# Patient Record
Sex: Female | Born: 1985 | Race: White | Hispanic: No | Marital: Married | Smoking: Never smoker
Health system: Southern US, Community
[De-identification: ages and names within clinical notes are randomized; demographics above are authoritative.]

## PROBLEM LIST (undated history)

## (undated) DIAGNOSIS — M1990 Unspecified osteoarthritis, unspecified site: Secondary | ICD-10-CM

## (undated) DIAGNOSIS — N8000 Endometriosis of the uterus, unspecified: Secondary | ICD-10-CM

## (undated) HISTORY — DX: Unspecified osteoarthritis, unspecified site: M19.90

## (undated) HISTORY — PX: TONSILLECTOMY: SUR1361

## (undated) HISTORY — DX: Endometriosis of the uterus, unspecified: N80.00

## (undated) HISTORY — DX: Endometriosis of uterus: N80.0

---
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
## 2007-01-14 ENCOUNTER — Emergency Department: Payer: Self-pay | Admitting: Emergency Medicine

## 2007-08-19 ENCOUNTER — Ambulatory Visit: Payer: Self-pay | Admitting: Family Medicine

## 2008-03-20 ENCOUNTER — Ambulatory Visit: Payer: Self-pay | Admitting: Family Medicine

## 2008-09-11 ENCOUNTER — Emergency Department: Payer: Self-pay | Admitting: Emergency Medicine

## 2009-05-02 DIAGNOSIS — N809 Endometriosis, unspecified: Secondary | ICD-10-CM | POA: Insufficient documentation

## 2009-07-10 ENCOUNTER — Ambulatory Visit: Payer: Self-pay | Admitting: Internal Medicine

## 2009-07-11 ENCOUNTER — Ambulatory Visit: Payer: Self-pay | Admitting: Internal Medicine

## 2009-11-16 HISTORY — PX: LAPAROSCOPY: SHX197

## 2010-02-09 ENCOUNTER — Emergency Department: Payer: Self-pay | Admitting: Emergency Medicine

## 2010-02-11 ENCOUNTER — Emergency Department: Payer: Self-pay | Admitting: Unknown Physician Specialty

## 2010-03-17 ENCOUNTER — Emergency Department: Payer: Self-pay | Admitting: Emergency Medicine

## 2010-03-19 ENCOUNTER — Emergency Department: Payer: Self-pay | Admitting: Emergency Medicine

## 2010-04-22 ENCOUNTER — Ambulatory Visit: Payer: Self-pay | Admitting: Family Medicine

## 2010-06-15 ENCOUNTER — Emergency Department: Payer: Self-pay | Admitting: Emergency Medicine

## 2010-06-21 ENCOUNTER — Emergency Department: Payer: Self-pay | Admitting: Emergency Medicine

## 2010-07-13 IMAGING — CT CT ABD-PELV W/O CM
1 of 2 series · 15 of 32 positions shown, 19 images · non-contrast
Comparison: none

REASON FOR EXAM: (1) RUQ pain; (2) RLQ pain
COMMENTS:   May transport without cardiac monitor

PROCEDURE:     CT  - CT ABDOMEN AND PELVIS W[DATE]  [DATE]
RESULT:
TECHNIQUE: Noncontrast renal stone protocol CT of the abdomen and pelvis is
performed. Images are reconstructed in the axial plane at 3 mm slice
thickness.
There is no previous exam for comparison.

[Series 2: stone · axial · 0.60mm/px · z∈[-979,-592]mm · 15 of 146 slices shown, 19 images]
[im 11/146  soft-tissue]
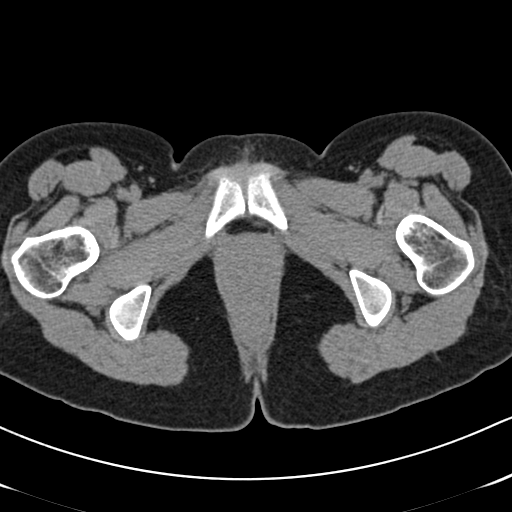
[im 11/146  bone]
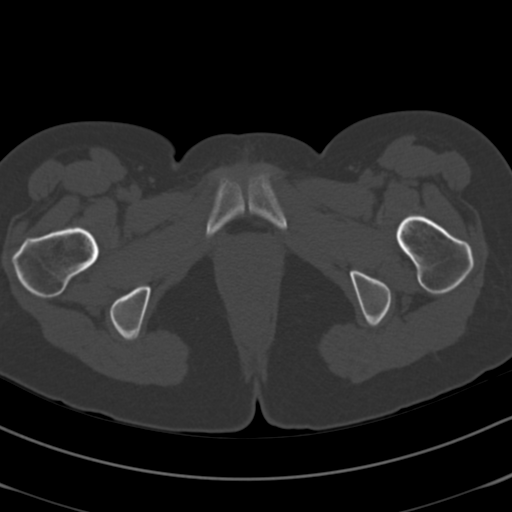
[im 21/146  soft-tissue]
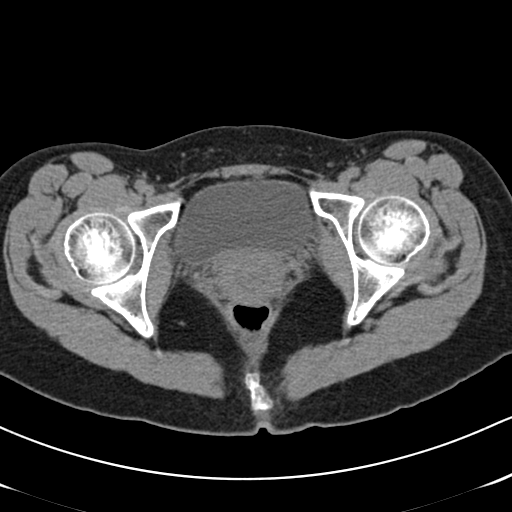
[im 32/146  soft-tissue]
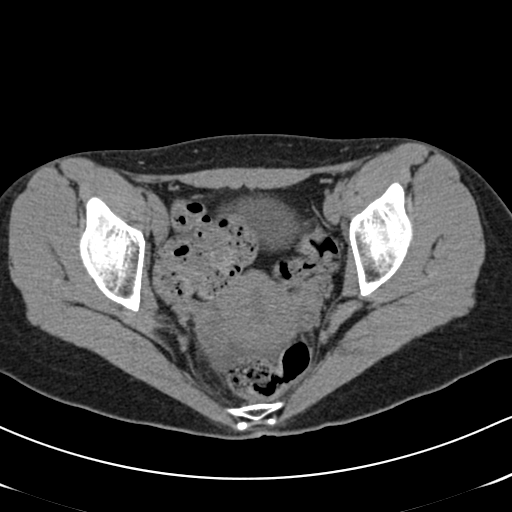
[im 42/146  soft-tissue]
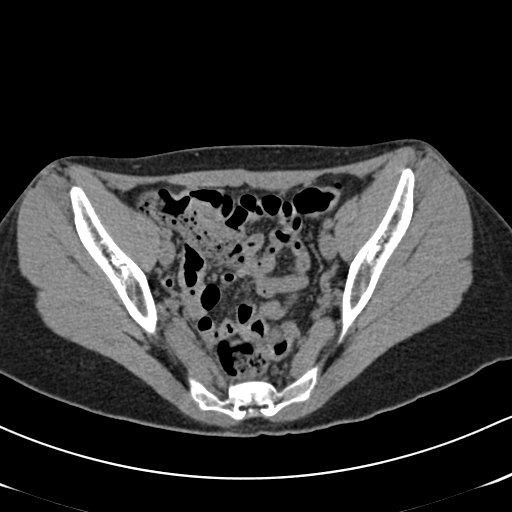
[im 52/146  soft-tissue]
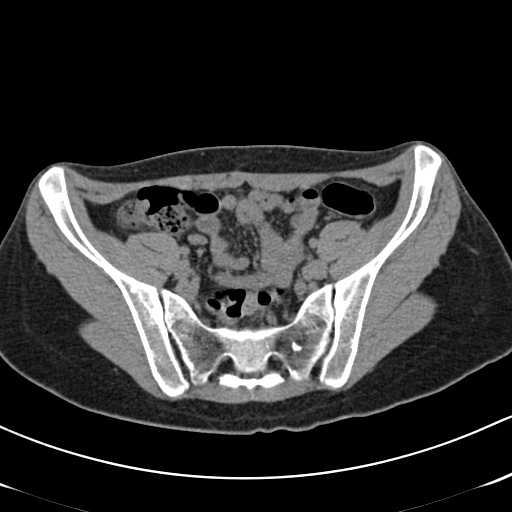
[im 63/146  soft-tissue]
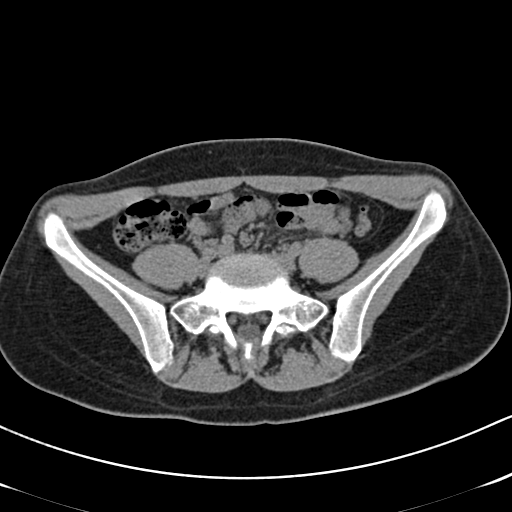
[im 73/146  soft-tissue]
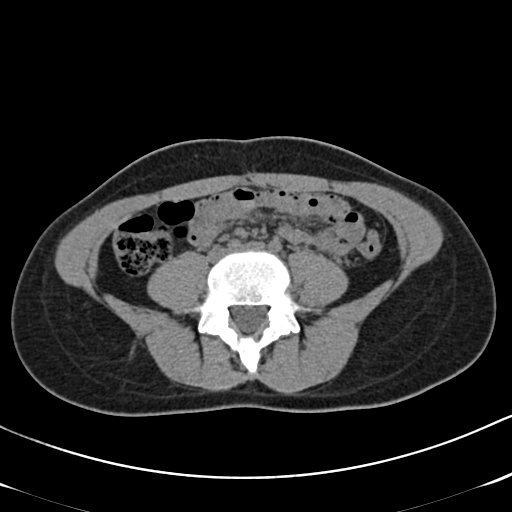
[im 83/146  soft-tissue]
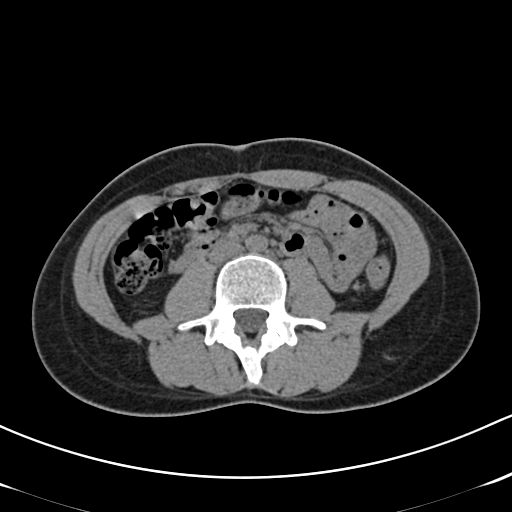
[im 94/146  soft-tissue]
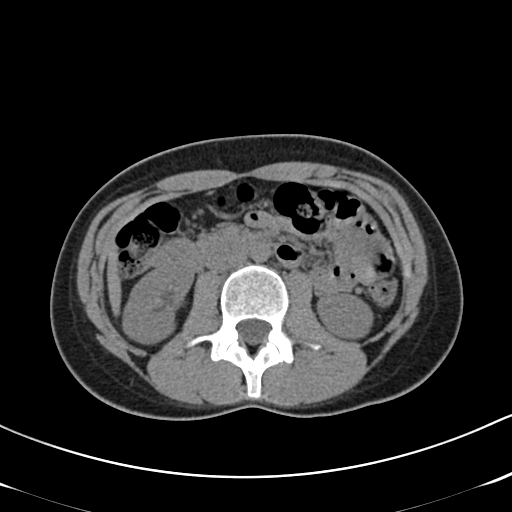
[im 94/146  bone]
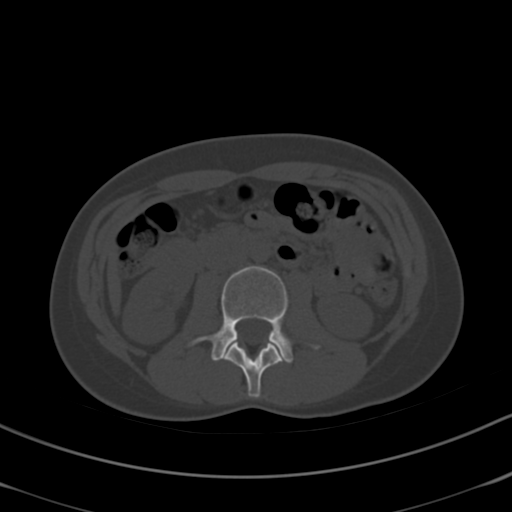
[im 104/146  soft-tissue]
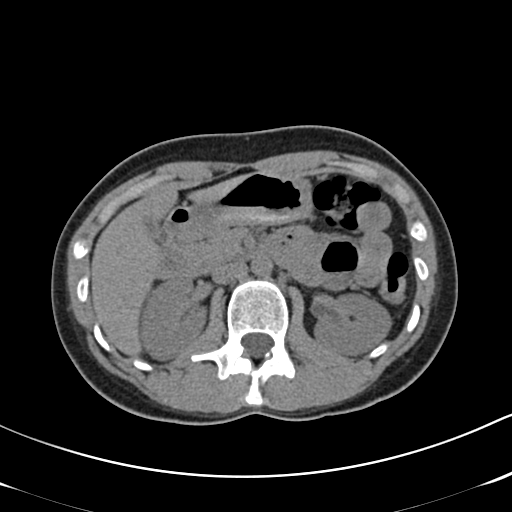
[im 114/146  soft-tissue]
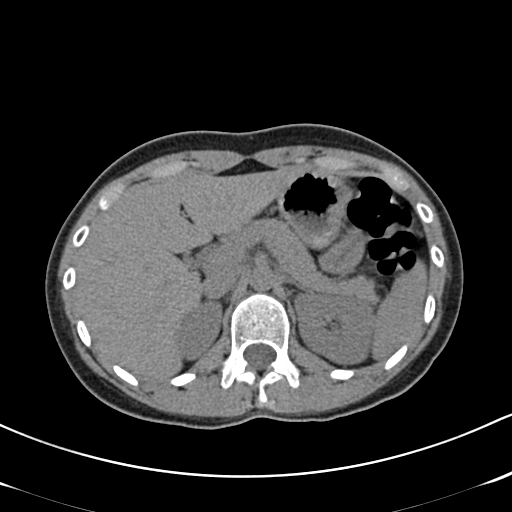
[im 125/146  soft-tissue]
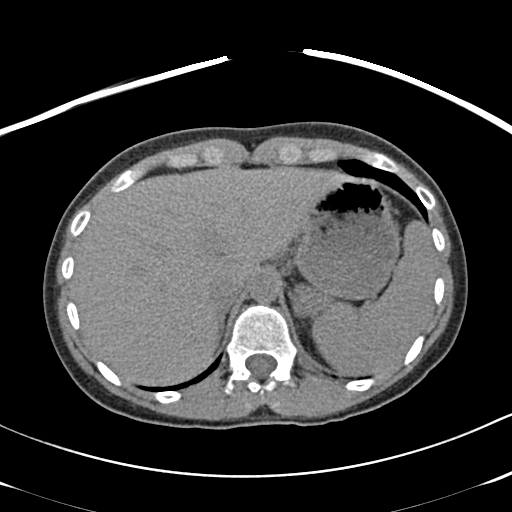
[im 125/146  lung]
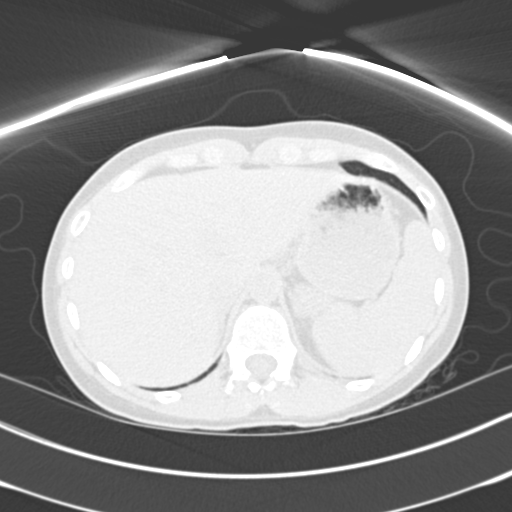
[im 130/146  lung]
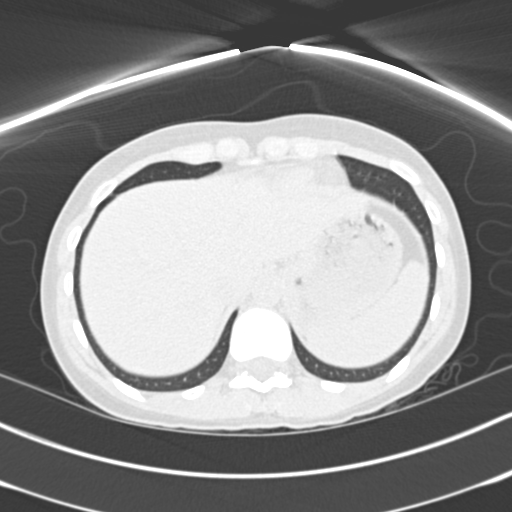
[im 135/146  soft-tissue]
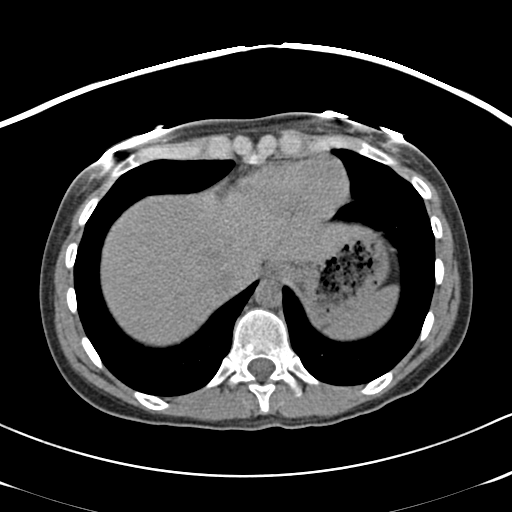
[im 135/146  lung]
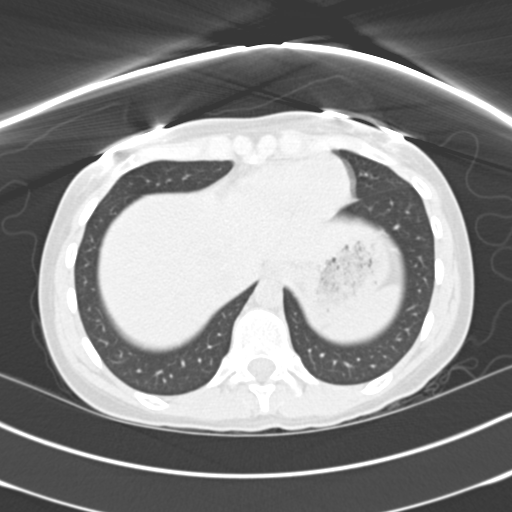
[im 140/146  lung]
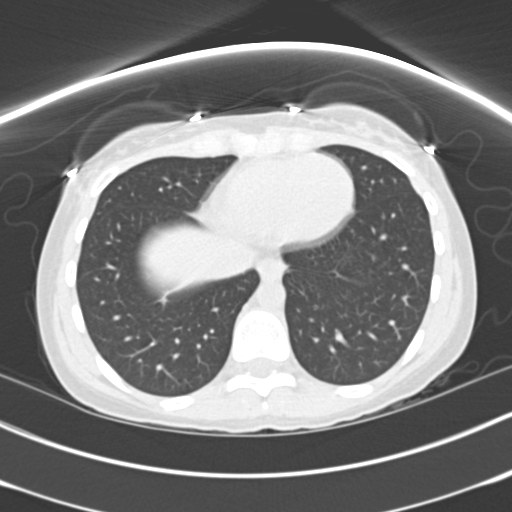

[15 of 32 positions shown; findings below may reference images not displayed]

FINDINGS: The lung bases appear clear. Noncontrast images of the liver,
spleen, pancreas, kidneys, aorta, adrenal glands and gallbladder appear
within normal limits. The gallbladder is not fully distended but no stones
are evident. The urinary bladder, uterus and ovaries appear unremarkable.
There is no abnormal bowel distention. There is a nonspecific small amount
of rectouterine cul-de-sac fluid. There is no abscess, free air or bowel
wall thickening. The appendix is seen and appears normal. No urinary tract
stones or obstructive changes are seen.
IMPRESSION: 1.  No urinary calculi or obstructive changes are present.
2.  The appendix appears normal.
3.  Possible right ovarian cyst.
4.  Small amount of free fluid in the cul-de-sac.

## 2010-07-18 ENCOUNTER — Ambulatory Visit: Payer: Self-pay

## 2010-07-24 ENCOUNTER — Ambulatory Visit: Payer: Self-pay

## 2010-07-30 ENCOUNTER — Emergency Department: Payer: Self-pay | Admitting: Emergency Medicine

## 2010-08-28 ENCOUNTER — Emergency Department: Payer: Self-pay | Admitting: Emergency Medicine

## 2011-01-17 ENCOUNTER — Emergency Department: Payer: Self-pay | Admitting: Emergency Medicine

## 2011-08-08 ENCOUNTER — Emergency Department: Payer: Self-pay | Admitting: Emergency Medicine

## 2012-03-17 ENCOUNTER — Emergency Department: Payer: Self-pay | Admitting: Internal Medicine

## 2012-03-17 LAB — URINALYSIS, COMPLETE
Glucose,UR: NEGATIVE mg/dL (ref 0–75)
Leukocyte Esterase: NEGATIVE
Nitrite: NEGATIVE
RBC,UR: <1 /HPF (ref 0–5)
Specific Gravity: 1.006 (ref 1.003–1.030)
WBC UR: <1 /HPF (ref 0–5)

## 2012-03-29 ENCOUNTER — Emergency Department: Payer: Self-pay | Admitting: Emergency Medicine

## 2012-03-29 LAB — URINALYSIS, COMPLETE
Bilirubin,UR: NEGATIVE
Glucose,UR: NEGATIVE mg/dL (ref 0–75)
RBC,UR: 1 /HPF (ref 0–5)
Specific Gravity: 1.025 (ref 1.003–1.030)
Squamous Epithelial: 8
WBC UR: 5 /HPF (ref 0–5)

## 2012-03-29 LAB — CBC
HCT: 40.1 % (ref 35.0–47.0)
HGB: 13.8 g/dL (ref 12.0–16.0)
MCH: 31 pg (ref 26.0–34.0)
MCV: 90 fL (ref 80–100)
Platelet: 279 10*3/uL (ref 150–440)
RBC: 4.46 10*6/uL (ref 3.80–5.20)
RDW: 13.5 % (ref 11.5–14.5)
WBC: 10.1 10*3/uL (ref 3.6–11.0)

## 2012-03-29 LAB — COMPREHENSIVE METABOLIC PANEL
Alkaline Phosphatase: 59 U/L (ref 50–136)
BUN: 5 mg/dL — ABNORMAL LOW (ref 7–18)
Bilirubin,Total: 0.7 mg/dL (ref 0.2–1.0)
Calcium, Total: 9.4 mg/dL (ref 8.5–10.1)
Creatinine: 0.47 mg/dL — ABNORMAL LOW (ref 0.60–1.30)
EGFR (African American): >60
Osmolality: 273 (ref 275–301)
Potassium: 4.1 mmol/L (ref 3.5–5.1)
SGOT(AST): 26 U/L (ref 15–37)
Total Protein: 7.8 g/dL (ref 6.4–8.2)

## 2012-03-29 LAB — HCG, QUANTITATIVE, PREGNANCY: Beta Hcg, Quant.: 78239 m[IU]/mL — ABNORMAL HIGH

## 2012-10-07 ENCOUNTER — Observation Stay: Payer: Self-pay | Admitting: Obstetrics and Gynecology

## 2012-11-04 ENCOUNTER — Ambulatory Visit: Payer: Self-pay | Admitting: Obstetrics and Gynecology

## 2012-11-11 ENCOUNTER — Inpatient Hospital Stay: Payer: Self-pay | Admitting: Obstetrics and Gynecology

## 2012-11-11 LAB — CBC WITH DIFFERENTIAL/PLATELET
Basophil #: 0.1 10*3/uL (ref 0.0–0.1)
Basophil %: 0.6 %
Eosinophil #: 0.6 10*3/uL (ref 0.0–0.7)
HCT: 34.4 % — ABNORMAL LOW (ref 35.0–47.0)
Lymphocyte #: 3.5 10*3/uL (ref 1.0–3.6)
Lymphocyte %: 15.7 %
MCH: 29.6 pg (ref 26.0–34.0)
MCHC: 34.9 g/dL (ref 32.0–36.0)
MCV: 85 fL (ref 80–100)
Monocyte #: 0.9 x10 3/mm (ref 0.2–0.9)
Monocyte %: 3.9 %
Neutrophil #: 17.1 10*3/uL — ABNORMAL HIGH (ref 1.4–6.5)
Platelet: 305 10*3/uL (ref 150–440)
RDW: 13.9 % (ref 11.5–14.5)
WBC: 22.2 10*3/uL — ABNORMAL HIGH (ref 3.6–11.0)

## 2012-11-13 LAB — HEMATOCRIT: HCT: 29.6 % — ABNORMAL LOW (ref 35.0–47.0)

## 2015-03-26 NOTE — H&P (Signed)
L&D Evaluation:  History:   HPI 29yo G1 at 7876w1d by D= 1st trimester U/S presents with c/o contractions increasing in frequency and intensity since membrane stripping this morning in the office.  No LOF or VB.  Good FM.  [id number]  PNLL - O+, RI, VI, GBS neg    Presents with contractions    Patient's Medical History bulging disk    Patient's Surgical History dx laparoscopy    Medications Pre Natal Vitamins    Allergies Sulfa, imitrex    Social History none    Family History Non-Contributory   ROS:   ROS All systems were reviewed.  HEENT, CNS, GI, GU, Respiratory, CV, Renal and Musculoskeletal systems were found to be normal.   Exam:   Vital Signs stable    General no apparent distress    Mental Status clear    Chest normal effort    Abdomen gravid, non-tender    Estimated Fetal Weight Average for gestational age    Pelvic no external lesions, 1 --> 2cm over 1 hour on L&D    Mebranes Intact    FHT normal rate with no decels, reactive NST    Ucx regular, every 3-5 min    Skin dry    Lymph no lymphadenopathy    Impression:   Impression G1 at 5076w1d in early labor   Plan:   Plan EFM/NST, monitor contractions and for cervical change    Comments expectant management   Electronic Signatures: Garnette GunnerStansbury Clipp, Ali LoweEryn K (MD)  (Signed 27-Dec-13 17:26)  Authored: L&D Evaluation   Last Updated: 27-Dec-13 17:26 by Garnette GunnerStansbury Clipp, Ali LoweEryn K (MD)

## 2015-11-18 ENCOUNTER — Emergency Department: Payer: Self-pay

## 2015-11-18 ENCOUNTER — Encounter: Payer: Self-pay | Admitting: Emergency Medicine

## 2015-11-18 ENCOUNTER — Emergency Department
Admission: EM | Admit: 2015-11-18 | Discharge: 2015-11-18 | Disposition: A | Discharge status: Home / Self Care | Payer: Self-pay | Attending: Emergency Medicine | Admitting: Emergency Medicine

## 2015-11-18 DIAGNOSIS — W07XXXA Fall from chair, initial encounter: Secondary | ICD-10-CM | POA: Insufficient documentation

## 2015-11-18 DIAGNOSIS — Y9289 Other specified places as the place of occurrence of the external cause: Secondary | ICD-10-CM | POA: Insufficient documentation

## 2015-11-18 DIAGNOSIS — W19XXXA Unspecified fall, initial encounter: Secondary | ICD-10-CM

## 2015-11-18 DIAGNOSIS — S52121A Displaced fracture of head of right radius, initial encounter for closed fracture: Secondary | ICD-10-CM | POA: Insufficient documentation

## 2015-11-18 DIAGNOSIS — Y998 Other external cause status: Secondary | ICD-10-CM | POA: Insufficient documentation

## 2015-11-18 DIAGNOSIS — S3992XA Unspecified injury of lower back, initial encounter: Secondary | ICD-10-CM | POA: Insufficient documentation

## 2015-11-18 DIAGNOSIS — Y9389 Activity, other specified: Secondary | ICD-10-CM | POA: Insufficient documentation

## 2015-11-18 MED ORDER — NAPROXEN 500 MG PO TABS
500.0000 mg | ORAL_TABLET | Freq: Two times a day (BID) | ORAL | Status: DC
Start: 2015-11-18 — End: 2020-09-25

## 2015-11-18 MED ORDER — OXYCODONE-ACETAMINOPHEN 5-325 MG PO TABS: 1.0000 | ORAL_TABLET | ORAL | Status: DC | PRN

## 2015-11-18 NOTE — ED Provider Notes (Signed)
[facility] Emergency Department Provider Note  ____________________________________________  Time seen: Approximately 1:44 PM  I have reviewed the triage vital signs and the nursing notes.   HISTORY  Chief Complaint Arm Pain and Back Pain   HPI Penny Day is a 30 y.o. female who presents with complaints of falling off a chair on Saturday while trying to take down [employer] decorations. States that she landed on her right arm, elbow and lower back. Complains of bruising to her lower back.   History reviewed. No pertinent past medical history.  There are no active problems to display for this patient.   History reviewed. No pertinent past surgical history.  Current Outpatient Rx  Name  Route  Sig  Dispense  Refill  . naproxen (NAPROSYN) 500 MG tablet   Oral   Take 1 tablet (500 mg total) by mouth 2 (two) times daily with a meal.   60 tablet   0   . oxyCODONE-acetaminophen (ROXICET) 5-325 MG tablet   Oral   Take 1-2 tablets by mouth every 4 (four) hours as needed for severe pain.   15 tablet   0     Allergies Sulfa antibiotics  No family history on file.  Social History Social History  Substance Use Topics  . Smoking status: Unknown If Ever Smoked  . Smokeless tobacco: None  . Alcohol Use: No    Review of Systems Constitutional: No fever/chills Eyes: No visual changes. ENT: No sore throat. Cardiovascular: Denies chest pain. Respiratory: Denies shortness of breath. Gastrointestinal: No abdominal pain.  No nausea, no vomiting.  No diarrhea.  No constipation. Genitourinary: Negative for dysuria. Musculoskeletal: Positive for right elbow pain. Skin: Negative for rash. Neurological: Negative for headaches, focal weakness or numbness.  10-point ROS otherwise negative.  ____________________________________________   PHYSICAL EXAM:  VITAL SIGNS: ED Triage Vitals  Enc Vitals Group     BP 11/18/15 1225 119/80 mmHg     Pulse  Rate 11/18/15 1225 97     Resp 11/18/15 1225 16     Temp 11/18/15 1225 98.1 F (36.7 C)     Temp Source 11/18/15 1225 Oral     SpO2 11/18/15 1225 98 %     Weight 11/18/15 1307 120 lb (54.432 kg)     Height 11/18/15 1307 5\' 5"  (1.651 m)     Head Cir --      Peak Flow --      Pain Score 11/18/15 1226 8     Pain Loc --      Pain Edu? --      Excl. in GC? --     Constitutional: Alert and oriented. Well appearing and in no acute distress. Cardiovascular: Normal rate, regular rhythm. Grossly normal heart sounds.  Good peripheral circulation. Respiratory: Normal respiratory effort.  No retractions. Lungs CTAB. Musculoskeletal: Right arm with limited range of motion. Increased pain with full extension. Distally neurovascularly intact. Neurologic:  Normal speech and language. No gross focal neurologic deficits are appreciated. No gait instability. Skin:  Skin is warm, dry and intact. No rash noted. Psychiatric: Mood and affect are normal. Speech and behavior are normal.  ____________________________________________   LABS (all labs ordered are listed, but only abnormal results are displayed)  Labs Reviewed - No data to display   RADIOLOGY   FINDINGS: Four views study shows no definite fracture. There is some subtle cortical irregularity along the anterior radial head, but no definite fracture line visible. No subluxation or dislocation. Posterior fat  pad is visible and the anterior fat pad is elevated, imaging features consistent with joint effusion.  IMPRESSION: Joint effusion without a definite fracture evident. Occult radial head fracture would be a consideration. _________________________________________   PROCEDURES  Procedure(s) performed: None  Critical Care performed: No  ____________________________________________   INITIAL IMPRESSION / ASSESSMENT AND PLAN / ED COURSE  Pertinent labs & imaging results that were available during my care of the patient were  reviewed by me and considered in my medical decision making (see chart for details).  Radial head fracture. Rx given for posterior splint Rx Naprosyn, Percocet 5/325. Sling applied. Patient follow-up with orthopedics in the morning. Patient voices no other emergency medical complaints at this time. ____________________________________________   FINAL CLINICAL IMPRESSION(S) / ED DIAGNOSES  Final diagnoses:  Radial head fracture, closed, right, initial encounter      Evangeline Dakin, PA-C 11/18/15 1353  Sharyn Creamer, MD 11/18/15 [phone]

## 2015-11-18 NOTE — ED Notes (Signed)
Pt reports falling off chair on Saturday while trying to take down birthday decorations; reports continued pain to right arm and right lower back; reports bruising to lower back. Pt able to move fingers and distal sensation intact.

## 2015-11-18 NOTE — ED Notes (Signed)
States she fell from chair on sat  Pain and swelling noted to right elbow  Bruising noted to left upper arm

## 2015-11-18 NOTE — Discharge Instructions (Signed)
Cast or Splint Care °Casts and splints support injured limbs and keep bones from moving while they heal. It is important to care for your cast or splint at home.   °HOME CARE INSTRUCTIONS °· Keep the cast or splint uncovered during the drying period. It can take 24 to 48 hours to dry if it is made of plaster. A fiberglass cast will dry in less than 1 hour. °· Do not rest the cast on anything harder than a pillow for the first 24 hours. °· Do not put weight on your injured limb or apply pressure to the cast until your health care provider gives you permission. °· Keep the cast or splint dry. Wet casts or splints can lose their shape and may not support the limb as well. A wet cast that has lost its shape can also create harmful pressure on your skin when it dries. Also, wet skin can become infected. °· Cover the cast or splint with a plastic bag when bathing or when out in the rain or snow. If the cast is on the trunk of the body, take sponge baths until the cast is removed. °· If your cast does become wet, dry it with a towel or a blow dryer on the cool setting only. °· Keep your cast or splint clean. Soiled casts may be wiped with a moistened cloth. °· Do not place any hard or soft foreign objects under your cast or splint, such as cotton, toilet paper, lotion, or powder. °· Do not try to scratch the skin under the cast with any object. The object could get stuck inside the cast. Also, scratching could lead to an infection. If itching is a problem, use a blow dryer on a cool setting to relieve discomfort. °· Do not trim or cut your cast or remove padding from inside of it. °· Exercise all joints next to the injury that are not immobilized by the cast or splint. For example, if you have a long leg cast, exercise the hip joint and toes. If you have an arm cast or splint, exercise the shoulder, elbow, thumb, and fingers. °· Elevate your injured arm or leg on 1 or 2 pillows for the first 1 to 3 days to decrease  swelling and pain. It is best if you can comfortably elevate your cast so it is higher than your heart. °SEEK MEDICAL CARE IF:  °· Your cast or splint cracks. °· Your cast or splint is too tight or too loose. °· You have unbearable itching inside the cast. °· Your cast becomes wet or develops a soft spot or area. °· You have a bad smell coming from inside your cast. °· You get an object stuck under your cast. °· Your skin around the cast becomes red or raw. °· You have new pain or worsening pain after the cast has been applied. °SEEK IMMEDIATE MEDICAL CARE IF:  °· You have fluid leaking through the cast. °· You are unable to move your fingers or toes. °· You have discolored (blue or white), cool, painful, or very swollen fingers or toes beyond the cast. °· You have tingling or numbness around the injured area. °· You have severe pain or pressure under the cast. °· You have any difficulty with your breathing or have shortness of breath. °· You have chest pain. °  °This information is not intended to replace advice given to you by your health care provider. Make sure you discuss any questions you have with your health care   provider.   Document Released: 10/30/2000 Document Revised: 08/23/2013 Document Reviewed: 05/11/2013 Elsevier Interactive Patient Education 2016 [employer]  Radial Head Fracture A radial head fracture is a break of the smaller bone (radius) in the forearm. The head of this bone is the part near the elbow. These fractures commonly happen during a fall, when you land on an outstretched arm. These fractures are more common in middle aged adults and are common with a dislocation of the elbow. SYMPTOMS   Swelling of the elbow joint and pain on the outside of the elbow.  Pain and difficulty in bending or straightening the elbow.  Pain and difficulty in turning the palm of the hand up or down with the elbow bent. DIAGNOSIS  Your caregiver may make this diagnosis by a physical exam.  X-rays can confirm the type and amount of fracture. Sometimes a fracture that is not displaced cannot be seen on the original X-ray. TREATMENT  Radial head fractures are classified according to the amount of movement (displacement) of parts from the normal position.  Type 1 Fractures  Type 1 fractures are generally small fractures in which bone pieces remain together (nondisplaced fracture).  The fracture may not be seen on initial X-rays. Usually if X-rays are repeated two to three weeks later, the fracture will show up. A splint or sling is used for a few days. Gentle early motion is used to prevent the elbow from becoming stiff. It should not be done vigorously or forced as this could displace the bone pieces. Type 2 Fractures  With type 2 fractures, bone pieces are slightly displaced and larger pieces of bone are broken off.  If only a little displacement of the bone piece is present, splinting for 4 to 5 days usually works well. This is again followed with gentle active range of motion. Small fragments may be surgically removed.  Large pieces of bone that can be put back into place will sometimes be fixed with pins or screws to hold them until the bone is healed. If this cannot be done, the fragments are removed. For older, less active people, sometimes the entire radial head is removed if the wrist is not injured. The elbow and arm will still work fine. Soft tissue, tendon, and ligament injuries are corrected at the same time. Type 3 Fractures  Type 3 fractures have multiple broken pieces of bone that cannot be fixed. Surgery is usually needed to remove the broken bits of bone and what is left of the radial head. Soft-tissue damage is repaired. Gentle early motion is used to prevent the elbow from becoming stiff. Sometimes an artificial radial head can be used to prevent deformity if the elbow is unstable. Rest, ice, elevation, immobilization, medications, and pain control are used in the  early care. HOME CARE INSTRUCTIONS   Keep the injured part elevated while sitting or lying down. Keep the injury above the level of your heart (the center of the chest). This will decrease swelling and pain.  Apply ice to the injury for 15-20 minutes, 03-04 times per day while awake, for 2 days. Put the ice in a plastic bag and place a towel between the bag of ice and your cast or splint.  Move your fingers to avoid stiffness and minimize swelling.  If you have a plaster or fiberglass cast:  Do not try to scratch the skin under the cast using sharp or pointed objects.  Check the skin around the cast every day. You may put  lotion on any red or sore areas. °¨ Keep your cast dry and clean. °· If you have a plaster splint: °¨ Wear the splint as directed. °¨ You may loosen the elastic around the splint if your fingers become numb, tingle, or turn cold or blue. °· Do not put pressure on any part of your cast or splint. It may break. Rest your cast only on a pillow for the first 24 hours until it is fully hardened. °· Your cast or splint can be protected during bathing with a plastic bag. Do not lower the cast or splint into the water. °· Only take over-the-counter or prescription medicines for pain, discomfort, or fever as directed by your caregiver. °· Follow all instructions for follow-up with your caregiver. This includes any orthopedic referrals, physical therapy, and rehabilitation. Any delay in obtaining necessary care could result in a delay or failure of the bones to heal or permanent elbow stiffness. °· Do not overdo exercises. This could further damage your injury. °SEEK IMMEDIATE MEDICAL CARE IF:  °· Your cast or splint gets damaged or breaks. °· You have more severe pain or swelling than you did before getting the cast. °· You have severe pain when stretching your fingers. °· There is a bad smell, new stains, and/or pus-like (purulent) drainage coming from under the cast. °· Your fingers or hand  turn pale or blue, become cold, or you lose feeling. °  °This information is not intended to replace advice given to you by your health care provider. Make sure you discuss any questions you have with your health care provider. °  °Document Released: 08/24/2006 Document Revised: 11/23/2014 Document Reviewed: 05/15/2015 °Elsevier Interactive Patient Education ©2016 [employer] ° °

## 2015-11-19 ENCOUNTER — Emergency Department: Admission: EM | Admit: 2015-11-19 | Discharge: 2015-11-19 | Discharge status: Absent without leave | Payer: Self-pay

## 2016-06-30 DIAGNOSIS — Z3042 Encounter for surveillance of injectable contraceptive: Secondary | ICD-10-CM | POA: Diagnosis not present

## 2016-06-30 DIAGNOSIS — Z0000 Encounter for general adult medical examination without abnormal findings: Secondary | ICD-10-CM | POA: Diagnosis not present

## 2016-09-15 DIAGNOSIS — Z3042 Encounter for surveillance of injectable contraceptive: Secondary | ICD-10-CM | POA: Diagnosis not present

## 2016-12-08 DIAGNOSIS — Z3042 Encounter for surveillance of injectable contraceptive: Secondary | ICD-10-CM | POA: Diagnosis not present

## 2016-12-10 DIAGNOSIS — K001 Supernumerary teeth: Secondary | ICD-10-CM | POA: Diagnosis not present

## 2016-12-10 DIAGNOSIS — S025XXB Fracture of tooth (traumatic), initial encounter for open fracture: Secondary | ICD-10-CM | POA: Diagnosis not present

## 2016-12-10 DIAGNOSIS — K0510 Chronic gingivitis, plaque induced: Secondary | ICD-10-CM | POA: Diagnosis not present

## 2016-12-17 DIAGNOSIS — Z1281 Encounter for screening for malignant neoplasm of oral cavity: Secondary | ICD-10-CM | POA: Diagnosis not present

## 2016-12-17 DIAGNOSIS — Z1289 Encounter for screening for malignant neoplasm of other sites: Secondary | ICD-10-CM | POA: Diagnosis not present

## 2016-12-18 DIAGNOSIS — Z1289 Encounter for screening for malignant neoplasm of other sites: Secondary | ICD-10-CM | POA: Diagnosis not present

## 2016-12-18 DIAGNOSIS — Z1281 Encounter for screening for malignant neoplasm of oral cavity: Secondary | ICD-10-CM | POA: Diagnosis not present

## 2016-12-18 DIAGNOSIS — J349 Unspecified disorder of nose and nasal sinuses: Secondary | ICD-10-CM | POA: Diagnosis not present

## 2017-03-08 DIAGNOSIS — Z309 Encounter for contraceptive management, unspecified: Secondary | ICD-10-CM | POA: Diagnosis not present

## 2017-04-14 ENCOUNTER — Ambulatory Visit: Payer: Self-pay | Admitting: Physician Assistant

## 2017-04-14 ENCOUNTER — Encounter: Payer: Self-pay | Admitting: Physician Assistant

## 2017-04-14 VITALS — BP 100/80 | HR 87 | Temp 98.8°F

## 2017-04-14 DIAGNOSIS — I889 Nonspecific lymphadenitis, unspecified: Secondary | ICD-10-CM

## 2017-04-14 NOTE — Progress Notes (Signed)
S: pt c/o swollen node on left side of neck, could see and feel it yesterday, has gone down today and now she can't feel where it was, denies fever/chills/cough/tick bite/or other swollen nodes  O: vitals wnl, nad, ent wnl, neck supple no lymph, lungs c ta ,c v rrr  A: lymphadenitis (resolved)  P: if begins to run a fever and if node reappears will call in an antibiotic, pt states she took a tick off of her son but has not seen one on herself

## 2017-05-25 DIAGNOSIS — N809 Endometriosis, unspecified: Secondary | ICD-10-CM | POA: Diagnosis not present

## 2017-05-25 DIAGNOSIS — Z3042 Encounter for surveillance of injectable contraceptive: Secondary | ICD-10-CM | POA: Diagnosis not present

## 2017-06-25 ENCOUNTER — Ambulatory Visit: Payer: Self-pay | Admitting: Physician Assistant

## 2017-08-17 DIAGNOSIS — Z3042 Encounter for surveillance of injectable contraceptive: Secondary | ICD-10-CM | POA: Diagnosis not present

## 2017-08-17 DIAGNOSIS — Z01419 Encounter for gynecological examination (general) (routine) without abnormal findings: Secondary | ICD-10-CM | POA: Diagnosis not present

## 2019-01-25 ENCOUNTER — Other Ambulatory Visit: Payer: Self-pay

## 2019-01-25 ENCOUNTER — Encounter: Payer: Self-pay | Admitting: Emergency Medicine

## 2019-01-25 ENCOUNTER — Emergency Department
Admission: EM | Admit: 2019-01-25 | Discharge: 2019-01-25 | Disposition: A | Discharge status: Home / Self Care | Payer: BLUE CROSS/BLUE SHIELD | Attending: Emergency Medicine | Admitting: Emergency Medicine

## 2019-01-25 DIAGNOSIS — T7840XA Allergy, unspecified, initial encounter: Secondary | ICD-10-CM | POA: Diagnosis not present

## 2019-01-25 LAB — BASIC METABOLIC PANEL
ANION GAP: 8 (ref 5–15)
BUN: <5 mg/dL — ABNORMAL LOW (ref 6–20)
CALCIUM: 9.5 mg/dL (ref 8.9–10.3)
CO2: 22 mmol/L (ref 22–32)
Chloride: 110 mmol/L (ref 98–111)
Creatinine, Ser: 0.65 mg/dL (ref 0.44–1.00)
GFR calc non Af Amer: >60 mL/min (ref >60)
GLUCOSE: 100 mg/dL — AB (ref 70–99)
Potassium: 3.7 mmol/L (ref 3.5–5.1)
Sodium: 140 mmol/L (ref 135–145)

## 2019-01-25 LAB — CBC WITH DIFFERENTIAL/PLATELET
Abs Immature Granulocytes: 0.04 10*3/uL (ref 0.00–0.07)
BASOS ABS: 0 10*3/uL (ref 0.0–0.1)
BASOS PCT: 0 %
EOS ABS: 0 10*3/uL (ref 0.0–0.5)
Eosinophils Relative: 0 %
HCT: 41.2 % (ref 36.0–46.0)
Hemoglobin: 14.1 g/dL (ref 12.0–15.0)
IMMATURE GRANULOCYTES: 0 %
Lymphocytes Relative: 11 %
Lymphs Abs: 1.3 10*3/uL (ref 0.7–4.0)
MCH: 30.7 pg (ref 26.0–34.0)
MCHC: 34.2 g/dL (ref 30.0–36.0)
MCV: 89.6 fL (ref 80.0–100.0)
Monocytes Absolute: 0.3 10*3/uL (ref 0.1–1.0)
Monocytes Relative: 2 %
NEUTROS PCT: 87 %
NRBC: 0 % (ref 0.0–0.2)
Neutro Abs: 10.6 10*3/uL — ABNORMAL HIGH (ref 1.7–7.7)
PLATELETS: 265 10*3/uL (ref 150–400)
RBC: 4.6 MIL/uL (ref 3.87–5.11)
RDW: 12.6 % (ref 11.5–15.5)
WBC: 12.3 10*3/uL — ABNORMAL HIGH (ref 4.0–10.5)

## 2019-01-25 LAB — TROPONIN I: Troponin I: <0.03 ng/mL (ref <0.03)

## 2019-01-25 MED ORDER — FAMOTIDINE IN NACL 20-0.9 MG/50ML-% IV SOLN
20.0000 mg | Freq: Once | INTRAVENOUS | Status: AC
  Administered 2019-01-25: 20 mg via INTRAVENOUS

## 2019-01-25 MED ORDER — METHYLPREDNISOLONE SODIUM SUCC 125 MG IJ SOLR: 125.0000 mg | Freq: Once | INTRAMUSCULAR | Status: DC

## 2019-01-25 MED ORDER — SODIUM CHLORIDE 0.9 % IV BOLUS
1000.0000 mL | Freq: Once | INTRAVENOUS | Status: AC
  Administered 2019-01-25: 1000 mL via INTRAVENOUS

## 2019-01-25 MED ORDER — DIPHENHYDRAMINE HCL 50 MG/ML IJ SOLN: 25.0000 mg | Freq: Once | INTRAMUSCULAR | Status: DC

## 2019-01-25 NOTE — ED Provider Notes (Signed)
[facility] Emergency Department Provider Note  ____________________________________________  Time seen: Approximately 3:36 PM  I have reviewed the triage vital signs and the nursing notes.   HISTORY  Chief Complaint Allergic Reaction   HPI Penny Day is a 33 y.o. female no significant past medical history who presents for evaluation of an allergic reaction.  Patient reports that she went to see her doctor today as she has been having cold-like symptoms for the last few days.  She reports congestion and postnasal drip.  She was told by the doctor that her symptoms were most likely allergies.  When the doctor return to give her a prescription for nasal spray she noted that the doctor's face was covered in hives which she felt was very strange.  She left the clinic and while sitting her car she reports that she started feeling that her tongue and lips felt funny and mildly numb but.  She then felt that her throat was closing and she was unable to swallow. At this time she still feels mild numbness of her tongue and lips but her throat feels normal.  She denies hives, vomiting, diarrhea.  She denies taking anything at the doctor's office.  She denies any prior history of anaphylaxis.  PMH None - reviewed  Past Surgical History:  Procedure Laterality Date  . TONSILLECTOMY      Prior to Admission medications   Medication Sig Start Date End Date Taking? Authorizing Provider  medroxyPROGESTERone (DEPO-PROVERA) 150 MG/ML injection Inject 150 mg into the muscle every 3 (three) months.    [provider]  naproxen (NAPROSYN) 500 MG tablet Take 1 tablet (500 mg total) by mouth 2 (two) times daily with a meal. Patient not taking: Reported on 04/14/2017 11/18/15   Beers, Charmayne Sheer, PA-C  oxyCODONE-acetaminophen (ROXICET) 5-325 MG tablet Take 1-2 tablets by mouth every 4 (four) hours as needed for severe pain. Patient not taking: Reported on 04/14/2017 11/18/15    Evangeline Dakin, PA-C    Allergies Sulfa antibiotics and Sumatriptan succinate  No family history on file.  Social History Social History   Tobacco Use  . Smoking status: Never Smoker  . Smokeless tobacco: Never Used  Substance Use Topics  . Alcohol use: No  . Drug use: No    Review of Systems  Constitutional: Negative for fever. Eyes: Negative for visual changes. ENT: Negative for sore throat. + throat closing sensation, congestion, rhinorrhea Neck: No neck pain  Cardiovascular: Negative for chest pain. Respiratory: Negative for shortness of breath. Gastrointestinal: Negative for abdominal pain, vomiting or diarrhea. Genitourinary: Negative for dysuria. Musculoskeletal: Negative for back pain. Skin: Negative for rash. Neurological: Negative for headaches, weakness or numbness. Psych: No SI or HI  ____________________________________________   PHYSICAL EXAM:  VITAL SIGNS: ED Triage Vitals  Enc Vitals Group     BP 01/25/19 1441 (!) 142/88     Pulse Rate 01/25/19 1441 (!) 110     Resp 01/25/19 1441 16     Temp 01/25/19 1441 98.2 F (36.8 C)     Temp Source 01/25/19 1441 Oral     SpO2 01/25/19 1441 100 %     Weight 01/25/19 1442 126 lb (57.2 kg)     Height 01/25/19 1442 5\' 6"  (1.676 m)     Head Circumference --      Peak Flow --      Pain Score 01/25/19 1442 0     Pain Loc --  Pain Edu? --      Excl. in GC? --     Constitutional: Alert and oriented, patient looks extremely anxious, shaky.  HEENT:      Head: Normocephalic and atraumatic.         Eyes: Conjunctivae are normal. Sclera is non-icteric.       Mouth/Throat: Mucous membranes are moist.  Tongue and uvula are normal with no swelling, no exudates, no erythema of the tonsils, airways patent, patient is handling saliva with no difficulties.  No stridor      Neck: Supple with no signs of meningismus. Cardiovascular: Tachycardic with regular rhythm. No murmurs, gallops, or rubs. 2+ symmetrical  distal pulses are present in all extremities. No JVD. Respiratory: Normal respiratory effort. Lungs are clear to auscultation bilaterally. No wheezes, crackles, or rhonchi.  Gastrointestinal: Soft, non tender, and non distended with positive bowel sounds. No rebound or guarding. Musculoskeletal: Nontender with normal range of motion in all extremities. No edema, cyanosis, or erythema of extremities. Neurologic: Normal speech and language. Face is symmetric. Moving all extremities. No gross focal neurologic deficits are appreciated. Skin: Skin is warm, dry and intact. No rash noted. Psychiatric: Mood and affect are normal. Speech and behavior are normal.  ____________________________________________   LABS (all labs ordered are listed, but only abnormal results are displayed)  Labs Reviewed  CBC WITH DIFFERENTIAL/PLATELET - Abnormal; Notable for the following components:      Result Value   WBC 12.3 (*)    Neutro Abs 10.6 (*)    All other components within normal limits  BASIC METABOLIC PANEL - Abnormal; Notable for the following components:   Glucose, Bld 100 (*)    BUN <5 (*)    All other components within normal limits  TROPONIN I   ____________________________________________  EKG  ED ECG REPORT I, Nita Sicklearolina Danayah Smyre, the attending physician, personally viewed and interpreted this ECG.  Sinus tachycardia, rate of 107, normal intervals, normal axis, no ST elevations or depressions.  Normal EKG. ____________________________________________  RADIOLOGY  none  ____________________________________________   PROCEDURES  Procedure(s) performed: None Procedures Critical Care performed:  None ____________________________________________   INITIAL IMPRESSION / ASSESSMENT AND PLAN / ED COURSE   33 y.o. female no significant past medical history who presents for evaluation of throat closing sensation and numbness of the lips and tongue that started while she was at the  doctor's office earlier today for congestion and postnasal drip.  Patient is well-appearing, looks extremely anxious and shaky.  She reports that she feels very anxious at this time.  No longer feeling the sensation of throat closing.  She is in no respiratory distress with normal clear lung sounds, no stridor, and normal oropharynx exam.  Most likely an allergic reaction due to viral illness versus viral mental allergy.  Due to tachycardia which is most likely caused by her anxiety an EKG and labs will be checked.  Will give Pepcid, Benadryl, and Solu-Medrol.  No indications for EpiPen   Labs showed no acute findings.  Patient was monitored in the ED with no further episodes of allergic reaction.  She was discharged home with follow-up with primary care doctor.   As part of my medical decision making, I reviewed the following data within the electronic MEDICAL RECORD NUMBER Nursing notes reviewed and incorporated, Labs reviewed , EKG interpreted , Notes from prior ED visits and [place] Controlled Substance Database    Pertinent labs & imaging results that were available during my care of the patient were  reviewed by me and considered in my medical decision making (see chart for details).    ____________________________________________   FINAL CLINICAL IMPRESSION(S) / ED DIAGNOSES  Final diagnoses:  Allergic reaction, initial encounter      NEW MEDICATIONS STARTED DURING THIS VISIT:  ED Discharge Orders    None       Note:  This document was prepared using Dragon voice recognition software and may include unintentional dictation errors.    Don Perking, [place], MD 01/26/19 0005

## 2019-01-25 NOTE — ED Notes (Signed)
Pt is very anxious and jumpy. Pt is nervous and requesting to not get Solu medrol and benadryl

## 2019-01-25 NOTE — ED Triage Notes (Signed)
Pt to ED via POV for c/o allergic reaction. Pt states that she was seeing her doctor at [facility], pt states that her doctor walked out of the room and when he came back in he had hives on his face. Pt states that she left the MD office and when she got in her car her tongue started to feel funny and her throat felt like it was closing. Pt is in NAD at this time.

## 2020-09-23 ENCOUNTER — Other Ambulatory Visit: Payer: Self-pay | Admitting: Advanced Practice Midwife

## 2020-09-23 DIAGNOSIS — O219 Vomiting of pregnancy, unspecified: Secondary | ICD-10-CM

## 2020-09-23 MED ORDER — PROMETHAZINE HCL 12.5 MG RE SUPP: 12.5000 mg | Freq: Four times a day (QID) | RECTAL | 1 refills | Status: DC | PRN

## 2020-09-23 NOTE — Progress Notes (Signed)
Rx phenergan 12.5 mg supp sent to patient pharmacy per her request for anti-nausea med.

## 2020-09-25 ENCOUNTER — Other Ambulatory Visit: Payer: Self-pay

## 2020-09-25 ENCOUNTER — Other Ambulatory Visit (HOSPITAL_COMMUNITY)
Admission: RE | Admit: 2020-09-25 | Discharge: 2020-09-25 | Disposition: A | Discharge status: Home / Self Care | Payer: BC Managed Care – PPO | Source: Ambulatory Visit | Attending: Advanced Practice Midwife | Admitting: Advanced Practice Midwife

## 2020-09-25 ENCOUNTER — Ambulatory Visit (INDEPENDENT_AMBULATORY_CARE_PROVIDER_SITE_OTHER): Payer: BC Managed Care – PPO | Admitting: Advanced Practice Midwife

## 2020-09-25 ENCOUNTER — Encounter: Payer: Self-pay | Admitting: Advanced Practice Midwife

## 2020-09-25 VITALS — BP 98/66 | Wt 100.0 lb

## 2020-09-25 DIAGNOSIS — Z113 Encounter for screening for infections with a predominantly sexual mode of transmission: Secondary | ICD-10-CM | POA: Insufficient documentation

## 2020-09-25 DIAGNOSIS — O219 Vomiting of pregnancy, unspecified: Secondary | ICD-10-CM

## 2020-09-25 DIAGNOSIS — Z3481 Encounter for supervision of other normal pregnancy, first trimester: Secondary | ICD-10-CM | POA: Insufficient documentation

## 2020-09-25 DIAGNOSIS — Z3490 Encounter for supervision of normal pregnancy, unspecified, unspecified trimester: Secondary | ICD-10-CM | POA: Insufficient documentation

## 2020-09-25 DIAGNOSIS — Z3A01 Less than 8 weeks gestation of pregnancy: Secondary | ICD-10-CM | POA: Diagnosis present

## 2020-09-25 DIAGNOSIS — O2341 Unspecified infection of urinary tract in pregnancy, first trimester: Secondary | ICD-10-CM

## 2020-09-25 DIAGNOSIS — N912 Amenorrhea, unspecified: Secondary | ICD-10-CM | POA: Diagnosis not present

## 2020-09-25 DIAGNOSIS — B951 Streptococcus, group B, as the cause of diseases classified elsewhere: Secondary | ICD-10-CM

## 2020-09-25 LAB — POCT URINE PREGNANCY: Preg Test, Ur: POSITIVE — AB

## 2020-09-25 MED ORDER — ONDANSETRON 4 MG PO TBDP: 4.0000 mg | ORAL_TABLET | Freq: Four times a day (QID) | ORAL | 2 refills | Status: AC | PRN

## 2020-09-25 MED ORDER — AMPICILLIN 500 MG PO CAPS: 500.0000 mg | ORAL_CAPSULE | Freq: Three times a day (TID) | ORAL | 0 refills | Status: DC

## 2020-09-25 MED ORDER — AMOXICILLIN 250 MG/5ML PO SUSR: 500.0000 mg | Freq: Three times a day (TID) | ORAL | 0 refills | Status: AC

## 2020-09-25 NOTE — Progress Notes (Signed)
New Obstetric Patient H&P    Chief Complaint: "Desires prenatal care"   History of Present Illness: Patient is a 34 y.o. Penny Day Not Hispanic or Latino female, presents with amenorrhea and positive home pregnancy test. Patient's last menstrual period was 08/01/2020 (exact date). and based on her  LMP, her EDD is Estimated Date of Delivery: 05/08/21 and her EGA is [redacted]w[redacted]d. Cycles are 7 days, regular, and occur approximately every : 14 days. Her last pap smear was 5 months ago at [facility] and was no abnormalities.    She had a urine pregnancy test which was positive 3 week(s)  ago. Her last menstrual period was normal and lasted for  7 day(s). Since her LMP she claims she has experienced breast tenderness, fatigue, nausea, vomiting. She had vaginal spotting on Sunday this week and was seen at [facility] ER. Ultrasound was done with dating consistent with LMP and small sub-chorionic hemorrhage. Since then spotting has been brown/watery. When she undressed for exam she felt fluid and noticed several bright red spots on her pad Her past medical history is contributory for endometriosis. Her prior pregnancies are notable for G1 2013 FT SVD female 6#, hyperemesis, G2 August 2021 SAB  Since her LMP, she admits to the use of tobacco products  no She claims she has lost  5 pounds since the start of her pregnancy.  There are cats in the home in the home  yes indoor- not exposed to litter She admits close contact with children on a regular basis  yes  She has had chicken pox in the past yes She has had Tuberculosis exposures, symptoms, or previously tested positive for TB   no Current or past history of domestic violence. no  Genetic Screening/Teratology Counseling: (Includes patient, baby's father, or anyone in either family with:)   1. Patient's age >/= 8 at [facility]  no 2. Thalassemia ([place], [place], Mediterranean, or Asian background): MCV<80  no 3. Neural tube defect (meningomyelocele, spina bifida, anencephaly)   no 4. Congenital heart defect  no  5. Down syndrome  no 6. Tay-Sachs (Jewish, [place]  no 7. Canavan's Disease  no 8. Sickle cell disease or trait (African)  no  9. Hemophilia or other blood disorders  no  10. Muscular dystrophy  no  11. Cystic fibrosis  no  12. Huntington's Chorea  no  13. Mental retardation/autism  no 14. Other inherited genetic or chromosomal disorder  no 15. Maternal metabolic disorder (DM, PKU, etc)  no 16. Patient or FOB with a child with a birth defect not listed above no  16a. Patient or FOB with a birth defect themselves no 17. Recurrent pregnancy loss, or stillbirth  no  18. Any medications since LMP other than prenatal vitamins (include vitamins, supplements, OTC meds, drugs, alcohol)  no 19. Any other genetic/environmental exposure to discuss  no  Infection History:   1. Lives with someone with TB or TB exposed  no  2. Patient or partner has history of genital herpes  no 3. Rash or viral illness since LMP  no 4. History of STI (GC, CT, HPV, syphilis, HIV)  no 5. History of recent travel :  no  Other pertinent information:  no     Review of Systems:10 point review of systems negative unless otherwise noted in HPI  Past Medical History:  Patient Active Problem List   Diagnosis Date Noted  . Endometriosis determined by laparoscopy 05/02/2009    Past Surgical History:  Past Surgical History:  Procedure Laterality Date  . LAPAROSCOPY  2011  . TONSILLECTOMY      Gynecologic History: Patient's last menstrual period was 08/01/2020 (exact date).  Obstetric History: G3P1011  Family History:  Family History  Problem Relation Age of Onset  . Cervical cancer Mother [id number]  . Hypertension Mother   . Hypertension Maternal Grandmother   . Ovarian cancer Maternal Grandmother 30  . Prostate cancer Maternal Grandfather     Social History:  Social History   Socioeconomic History  . Marital status: Married    Spouse name: Not on file  .  Number of children: Not on file  . Years of education: Not on file  . Highest education level: Not on file  Occupational History  . Not on file  Tobacco Use  . Smoking status: Never Smoker  . Smokeless tobacco: Never Used  Vaping Use  . Vaping Use: Never used  Substance and Sexual Activity  . Alcohol use: No  . Drug use: No  . Sexual activity: Yes    Partners: Male    Birth control/protection: None  Other Topics Concern  . Not on file  Social History Narrative  . Not on file   Social Determinants of Health   Financial Resource Strain:   . Difficulty of Paying Living Expenses: Not on file  Food Insecurity:   . Worried About Programme researcher, broadcasting/film/videounning Out of Food in the Last Year: Not on file  . Ran Out of Food in the Last Year: Not on file  Transportation Needs:   . Lack of Transportation (Medical): Not on file  . Lack of Transportation (Non-Medical): Not on file  Physical Activity:   . Days of Exercise per Week: Not on file  . Minutes of Exercise per Session: Not on file  Stress:   . Feeling of Stress : Not on file  Social Connections:   . Frequency of Communication with Friends and Family: Not on file  . Frequency of Social Gatherings with Friends and Family: Not on file  . Attends Religious Services: Not on file  . Active Member of Clubs or Organizations: Not on file  . Attends BankerClub or Organization Meetings: Not on file  . Marital Status: Not on file  Intimate Partner Violence:   . Fear of Current or Ex-Partner: Not on file  . Emotionally Abused: Not on file  . Physically Abused: Not on file  . Sexually Abused: Not on file    Allergies:  Allergies  Allergen Reactions  . Sulfa Antibiotics Hives  . Sumatriptan Succinate Nausea Only and Other (See Comments)    Other Reaction: OTHER REACTION-SOB, VOMI    Medications: Prior to Admission medications   Medication Sig Start Date End Date Taking? Authorizing Provider  amoxicillin (AMOXIL) 250 MG/5ML suspension Take 10 mLs (500 mg  total) by mouth 3 (three) times daily for 7 days. 09/25/20 10/02/20  Tresea MallGledhill, Windie Marasco, CNM  ondansetron (ZOFRAN ODT) 4 MG disintegrating tablet Take 1 tablet (4 mg total) by mouth every 6 (six) hours as needed for nausea. 09/25/20   Tresea MallGledhill, Qamar Aughenbaugh, CNM    Physical Exam Vitals: Blood pressure 98/66, weight 100 lb (45.4 kg), last menstrual period 08/01/2020.  General: NAD HEENT: normocephalic, anicteric Thyroid: no enlargement, no palpable nodules Pulmonary: No increased work of breathing, CTAB Cardiovascular: RRR, distal pulses 2+ Abdomen: NABS, soft, non-tender, non-distended.  Umbilicus without lesions.  No hepatomegaly, splenomegaly or masses palpable. No evidence of hernia  Genitourinary:  External: Normal external female genitalia.  Normal urethral meatus,  normal  Bartholin's and Skene's glands.    Vagina: Normal vaginal mucosa, no evidence of prolapse.    Cervix: Grossly normal in appearance, no active bleeding, scant mucousy blood in vault  Uterus:  Non-enlarged, mobile, normal contour.    Adnexa: ovaries non-enlarged, no adnexal masses  Rectal: deferred Extremities: no edema, erythema, or tenderness Neurologic: Grossly intact Psychiatric: mood appropriate, affect full   Dating ultrasound done at [facility] 09/22/20 is c/w LMP within 1 day, no adjustment of EDD, small subchorionic hemorrhage   The following were addressed during this visit:  Breastfeeding Education - Early initiation of breastfeeding    Comments: Keeps milk supply adequate, helps contract uterus and slow bleeding, and early milk is the perfect first food and is easy to digest.   - The importance of exclusive breastfeeding    Comments: Provides antibodies, Lower risk of breast and ovarian cancers, and type-2 diabetes,Helps your body recover, Reduced chance of SIDS.   - Risks of giving your baby anything other than breast milk if you are breastfeeding    Comments: Make the baby less content with breastfeeds, may  make my baby more susceptible to illness, and may reduce my milk supply.   - The importance of early skin-to-skin contact    Comments: Keeps baby warm and secure, helps keep baby's blood sugar up and breathing steady, easier to bond and breastfeed, and helps calm baby.  - Rooming-in on a 24-hour basis    Comments: Easier to learn baby's feeding cues, easier to bond and get to know each other, and encourages milk production.   - Feeding on demand or baby-led feeding    Comments: Helps prevent breastfeeding complications, helps bring in good milk supply, prevents under or overfeeding, and helps baby feel content and satisfied   - Frequent feeding to help assure optimal milk production    Comments: Making a full supply of milk requires frequent removal of milk from breasts, infant will eat 8-12 times in 24 hours, if separated from infant use breast massage, hand expression and/ or pumping to remove milk from breasts.   - Effective positioning and attachment    Comments: Helps my baby to get enough breast milk, helps to produce an adequate milk supply, and helps prevent nipple pain and damage   - Exclusive breastfeeding for the first 6 months    Comments: Builds a healthy milk supply and keeps it up, protects baby from sickness and disease, and breastmilk has everything your baby needs for the first 6 months.  - Individualized Education    Comments: Contraindications to breastfeeding and other special medical conditions Only breastfed for about a week with first baby- unable to continue due to lack of support/low supply     Assessment: 34 y.o. G3P1011 at [redacted]w[redacted]d presenting to initiate prenatal care  Plan: 1) Avoid alcoholic beverages. 2) Patient encouraged not to smoke.  3) Discontinue the use of all non-medicinal drugs and chemicals.  4) Take prenatal vitamins daily.  5) Nutrition, food safety (fish, cheese advisories, and high nitrite foods) and exercise discussed. 6) Hospital and  practice style discussed with cross coverage system.  7) Genetic Screening, such as with 1st Trimester Screening, cell free fetal DNA, AFP testing, and Ultrasound, as well as with amniocentesis and CVS as appropriate, is discussed with patient. At the conclusion of today's visit patient requested cell free DNA genetic testing 8) Patient is asked about travel to areas at risk for the Zika virus, and counseled to avoid travel and  exposure to mosquitoes or sexual partners who may have themselves been exposed to the virus. Testing is discussed, and will be ordered as appropriate.  9) Aptima, NOB panel today 10) Rx Amoxicillin suspension for GBS bacteriuria (diagnosed at [facility] 11/7) 11) Rx Zofran for excessive N/V- couldn't take prescribed phenergan due to sleepy side effect 12) Return to clinic in 2 weeks for ROB, N/V f/u, MaterniT 21   Tresea Mall, CNM [facility] 09/25/2020, 10:22 AM

## 2020-09-25 NOTE — Patient Instructions (Signed)
Morning Sickness  Morning sickness is when a woman feels nauseous during pregnancy. This nauseous feeling may or may not come with vomiting. It often occurs in the morning, but it can be a problem at any time of day. Morning sickness is most common during the first trimester. In some cases, it may continue throughout pregnancy. Although morning sickness is unpleasant, it is usually harmless unless the woman develops severe and continual vomiting (hyperemesis gravidarum), a condition that requires more intense treatment. What are the causes? The exact cause of this condition is not known, but it seems to be related to normal hormonal changes that occur in pregnancy. What increases the risk? You are more likely to develop this condition if:  You experienced nausea or vomiting before your pregnancy.  You had morning sickness during a previous pregnancy.  You are pregnant with more than one baby, such as twins. What are the signs or symptoms? Symptoms of this condition include:  Nausea.  Vomiting. How is this diagnosed? This condition is usually diagnosed based on your signs and symptoms. How is this treated? In many cases, treatment is not needed for this condition. Making some changes to what you eat may help to control symptoms. Your health care provider may also prescribe or recommend:  Vitamin B6 supplements.  Anti-nausea medicines.  Ginger. Follow these instructions at home: Medicines  Take over-the-counter and prescription medicines only as told by your health care provider. Do not use any prescription, over-the-counter, or herbal medicines for morning sickness without first talking with your health care provider.  Taking multivitamins before getting pregnant can prevent or decrease the severity of morning sickness in most women. Eating and drinking  Eat a piece of dry toast or crackers before getting out of bed in the morning.  Eat 5 or 6 small meals a day.  Eat dry and  bland foods, such as rice or a baked potato. Foods that are high in carbohydrates are often helpful.  Avoid greasy, fatty, and spicy foods.  Have someone cook for you if the smell of any food causes nausea and vomiting.  If you feel nauseous after taking prenatal vitamins, take the vitamins at night or with a snack.  Snack on protein foods between meals if you are hungry. Nuts, yogurt, and cheese are good options.  Drink fluids throughout the day.  Try ginger ale made with real ginger, ginger tea made from fresh grated ginger, or ginger candies. General instructions  Do not use any products that contain nicotine or tobacco, such as cigarettes and e-cigarettes. If you need help quitting, ask your health care provider.  Get an air purifier to keep the air in your house free of odors.  Get plenty of fresh air.  Try to avoid odors that trigger your nausea.  Consider trying these methods to help relieve symptoms: ? Wearing an acupressure wristband. These wristbands are often worn for seasickness. ? Acupuncture. Contact a health care provider if:  Your home remedies are not working and you need medicine.  You feel dizzy or light-headed.  You are losing weight. Get help right away if:  You have persistent and uncontrolled nausea and vomiting.  You faint.  You have severe pain in your abdomen. Summary  Morning sickness is when a woman feels nauseous during pregnancy. This nauseous feeling may or may not come with vomiting.  Morning sickness is most common during the first trimester.  It often occurs in the morning, but it can be a problem at   any time of day.  In many cases, treatment is not needed for this condition. Making some changes to what you eat may help to control symptoms. This information is not intended to replace advice given to you by your health care provider. Make sure you discuss any questions you have with your health care provider. Document Revised:  10/15/2017 Document Reviewed: 12/05/2016 Elsevier Patient Education  2020 [employer]. Exercise During Pregnancy Exercise is an important part of being healthy for people of all ages. Exercise improves the function of your heart and lungs and helps you maintain strength, flexibility, and a healthy body weight. Exercise also boosts energy levels and elevates mood. Most women should exercise regularly during pregnancy. In rare cases, women with certain medical conditions or complications may be asked to limit or avoid exercise during pregnancy. How does this affect me? Along with maintaining general strength and flexibility, exercising during pregnancy can help:  Keep strength in muscles that are used during labor and childbirth.  Decrease low back pain.  Reduce symptoms of depression.  Control weight gain during pregnancy.  Reduce the risk of needing insulin if you develop diabetes during pregnancy.  Decrease the risk of cesarean delivery.  Speed up your recovery after giving birth. How does this affect my baby? Exercise can help you have a healthy pregnancy. Exercise does not cause premature birth. It will not cause your baby to weigh less at birth. What exercises can I do? Many exercises are safe for you to do during pregnancy. Do a variety of exercises that safely increase your heart and breathing rates and help you build and maintain muscle strength. Do exercises exactly as told by your health care provider. You may do these exercises:  Walking or hiking.  Swimming.  Water aerobics.  Riding a stationary bike.  Strength training.  Modified yoga or Pilates. Tell your instructor that you are pregnant. Avoid overstretching, and avoid lying on your back for long periods of time.  Running or jogging. Only choose this type of exercise if you: ? Ran or jogged regularly before your pregnancy. ? Can run or jog and still talk in complete sentences. What exercises should I  avoid? Depending on your level of fitness and whether you exercised regularly before your pregnancy, you may be told to limit high-intensity exercise. You can tell that you are exercising at a high intensity if you are breathing much harder and faster and cannot hold a conversation while exercising. You must avoid:  Contact sports.  Activities that put you at risk for falling on or being hit in the belly, such as downhill skiing, water skiing, surfing, rock climbing, cycling, gymnastics, and horseback riding.  Scuba diving.  Skydiving.  Yoga or Pilates in a room that is heated to high temperatures.  Jogging or running, unless you ran or jogged regularly before your pregnancy. While jogging or running, you should always be able to talk in full sentences. Do not run or jog so fast that you are unable to have a conversation.  Do not exercise at more than 6,000 feet above sea level (high elevation) if you are not used to exercising at high elevation. How do I exercise in a safe way?   Avoid overheating. Do not exercise in very high temperatures.  Wear loose-fitting, breathable clothes.  Avoid dehydration. Drink enough water before, during, and after exercise to keep your urine pale yellow.  Avoid overstretching. Because of hormone changes during pregnancy, it is easy to overstretch muscles, tendons,  and ligaments during pregnancy.  Start slowly and ask your health care provider to recommend the types of exercise that are safe for you.  Do not exercise to lose weight. Follow these instructions at home:  Exercise on most days or all days of the week. Try to exercise for 30 minutes a day, 5 days a week, unless your health care provider tells you not to.  If you actively exercised before your pregnancy and you are healthy, your health care provider may tell you to continue to do moderate to high-intensity exercise.  If you are just starting to exercise or did not exercise much before your  pregnancy, your health care provider may tell you to do low to moderate-intensity exercise. Questions to ask your health care provider  Is exercise safe for me?  What are signs that I should stop exercising?  Does my health condition mean that I should not exercise during pregnancy?  When should I avoid exercising during pregnancy? Stop exercising and contact a health care provider if: You have any unusual symptoms, such as:  Mild contractions of the uterus or cramps in the abdomen.  Dizziness that does not go away when you rest. Stop exercising and get help right away if: You have any unusual symptoms, such as:  Sudden, severe pain in your low back or your belly.  Mild contractions of the uterus or cramps in the abdomen that do not improve with rest and drinking fluids.  Chest pain.  Bleeding or fluid leaking from your vagina.  Shortness of breath. These symptoms may represent a serious problem that is an emergency. Do not wait to see if the symptoms will go away. Get medical help right away. Call your local emergency services (911 in the [place] Do not drive yourself to the hospital. Summary  Most women should exercise regularly throughout pregnancy. In rare cases, women with certain medical conditions or complications may be asked to limit or avoid exercise during pregnancy.  Do not exercise to lose weight during pregnancy.  Your health care provider will tell you what level of physical activity is right for you.  Stop exercising and contact a health care provider if you have mild contractions of the uterus or cramps in the abdomen. Get help right away if these contractions or cramps do not improve with rest and drinking fluids.  Stop exercising and get help right away if you have sudden, severe pain in your low back or belly, chest pain, shortness of breath, or bleeding or leaking of fluid from your vagina. This information is not intended to replace advice given to you by  your health care provider. Make sure you discuss any questions you have with your health care provider. Document Revised: 02/23/2019 Document Reviewed: 12/07/2018 Elsevier Patient Education  2020 [employer]. Eating Plan for Pregnant Women While you are pregnant, your body requires additional nutrition to help support your growing baby. You also have a higher need for some vitamins and minerals, such as folic acid, calcium, iron, and vitamin D. Eating a healthy, well-balanced diet is very important for your health and your baby's health. Your need for extra calories varies for the three 62-month segments of your pregnancy (trimesters). For most women, it is recommended to consume:  150 extra calories a day during the first trimester.  300 extra calories a day during the second trimester.  300 extra calories a day during the third trimester. What are tips for following this plan?   Do not try  to lose weight or go on a diet during pregnancy.  Limit your overall intake of foods that have "empty calories." These are foods that have little nutritional value, such as sweets, desserts, candies, and sugar-sweetened beverages.  Eat a variety of foods (especially fruits and vegetables) to get a full range of vitamins and minerals.  Take a prenatal vitamin to help meet your additional vitamin and mineral needs during pregnancy, specifically for folic acid, iron, calcium, and vitamin D.  Remember to stay active. Ask your health care provider what types of exercise and activities are safe for you.  Practice good food safety and cleanliness. Wash your hands before you eat and after you prepare raw meat. Wash all fruits and vegetables well before peeling or eating. Taking these actions can help to prevent food-borne illnesses that can be very dangerous to your baby, such as listeriosis. Ask your health care provider for more information about listeriosis. What does 150 extra calories look like? Healthy  options that provide 150 extra calories each day could be any of the following:  6-8 oz (170-230 g) of plain low-fat yogurt with  cup of berries.  1 apple with 2 teaspoons (11 g) of peanut butter.  Cut-up vegetables with  cup (60 g) of hummus.  8 oz (230 mL) or 1 cup of low-fat chocolate milk.  1 stick of string cheese with 1 medium orange.  1 peanut butter and jelly sandwich that is made with one slice of whole-wheat bread and 1 tsp (5 g) of peanut butter. For 300 extra calories, you could eat two of those healthy options each day. What is a healthy amount of weight to gain? The right amount of weight gain for you is based on your BMI before you became pregnant. If your BMI:  Was less than 18 (underweight), you should gain 28-40 lb (13-18 kg).  Was 18-24.9 (normal), you should gain 25-35 lb (11-16 kg).  Was 25-29.9 (overweight), you should gain 15-25 lb (7-11 kg).  Was 30 or greater (obese), you should gain 11-20 lb (5-9 kg). What if I am having twins or multiples? Generally, if you are carrying twins or multiples:  You may need to eat 300-600 extra calories a day.  The recommended range for total weight gain is 25-54 lb (11-25 kg), depending on your BMI before pregnancy.  Talk with your health care provider to find out about nutritional needs, weight gain, and exercise that is right for you. What foods can I eat?  Fruits All fruits. Eat a variety of colors and types of fruit. Remember to wash your fruits well before peeling or eating. Vegetables All vegetables. Eat a variety of colors and types of vegetables. Remember to wash your vegetables well before peeling or eating. Grains All grains. Choose whole grains, such as whole-wheat bread, oatmeal, or brown rice. Meats and other protein foods Lean meats, including chicken, [place], fish, and lean cuts of beef, veal, or pork. If you eat fish or seafood, choose options that are higher in omega-3 fatty acids and lower in  mercury, such as salmon, herring, mussels, trout, sardines, pollock, shrimp, crab, and lobster. Tofu. Tempeh. Beans. Eggs. Peanut butter and other nut butters. Make sure that all meats, poultry, and eggs are cooked to food-safe temperatures or "well-done." Two or more servings of fish are recommended each week in order to get the most benefits from omega-3 fatty acids that are found in seafood. Choose fish that are lower in mercury. You can find more  information online:  PumpkinSearch.com.ee Dairy Pasteurized milk and milk alternatives (such as almond milk). Pasteurized yogurt and pasteurized cheese. Cottage cheese. Sour cream. Beverages Water. Juices that contain 100% fruit juice or vegetable juice. Caffeine-free teas and decaffeinated coffee. Drinks that contain caffeine are okay to drink, but it is better to avoid caffeine. Keep your total caffeine intake to less than 200 mg each day (which is 12 oz or 355 mL of coffee, tea, or soda) or the limit as told by your health care provider. Fats and oils Fats and oils are okay to include in moderation. Sweets and desserts Sweets and desserts are okay to include in moderation. Seasoning and other foods All pasteurized condiments. The items listed above may not be a complete list of foods and beverages you can eat. Contact a dietitian for more information. What foods are not recommended? Fruits Unpasteurized fruit juices. Vegetables Raw (unpasteurized) vegetable juices. Meats and other protein foods Lunch meats, bologna, hot dogs, or other deli meats. (If you must eat those meats, reheat them until they are steaming hot.) Refrigerated pat, meat spreads from a meat counter, smoked seafood that is found in the refrigerated section of a store. Raw or undercooked meats, poultry, and eggs. Raw fish, such as sushi or sashimi. Fish that have high mercury content, such as tilefish, shark, swordfish, and king mackerel. To learn more about mercury in fish, talk with  your health care provider or look for online resources, such as:  PumpkinSearch.com.ee Dairy Raw (unpasteurized) milk and any foods that have raw milk in them. Soft cheeses, such as feta, queso blanco, queso fresco, Brie, Camembert cheeses, blue-veined cheeses, and Panela cheese (unless it is made with pasteurized milk, which must be stated on the label). Beverages Alcohol. Sugar-sweetened beverages, such as sodas, teas, or energy drinks. Seasoning and other foods Homemade fermented foods and drinks, such as pickles, sauerkraut, or kombucha drinks. (Store-bought pasteurized versions of these are okay.) Salads that are made in a store or deli, such as ham salad, chicken salad, egg salad, tuna salad, and seafood salad. The items listed above may not be a complete list of foods and beverages you should avoid. Contact a dietitian for more information. Where to find more information To calculate the number of calories you need based on your height, weight, and activity level, you can use an online calculator such as:  PackageNews.is To calculate how much weight you should gain during pregnancy, you can use an online pregnancy weight gain calculator such as:  http://jones-berg.com/ Summary  While you are pregnant, your body requires additional nutrition to help support your growing baby.  Eat a variety of foods, especially fruits and vegetables to get a full range of vitamins and minerals.  Practice good food safety and cleanliness. Wash your hands before you eat and after you prepare raw meat. Wash all fruits and vegetables well before peeling or eating. Taking these actions can help to prevent food-borne illnesses, such as listeriosis, that can be very dangerous to your baby.  Do not eat raw meat or fish. Do not eat fish that have high mercury content, such as tilefish, shark, swordfish, and king mackerel. Do not eat unpasteurized (raw) dairy.  Take a  prenatal vitamin to help meet your additional vitamin and mineral needs during pregnancy, specifically for folic acid, iron, calcium, and vitamin D. This information is not intended to replace advice given to you by your health care provider. Make sure you discuss any questions you have with your health  care provider. Document Revised: 03/23/2019 Document Reviewed: 07/30/2017 Elsevier Patient Education  2020 [employer]. Prenatal Care Prenatal care is health care during pregnancy. It helps you and your unborn baby (fetus) stay as healthy as possible. Prenatal care may be provided by a midwife, a family practice health care provider, or a childbirth and pregnancy specialist (obstetrician). How does this affect me? During pregnancy, you will be closely monitored for any new conditions that might develop. To lower your risk of pregnancy complications, you and your health care provider will talk about any underlying conditions you have. How does this affect my baby? Early and consistent prenatal care increases the chance that your baby will be healthy during pregnancy. Prenatal care lowers the risk that your baby will be:  Born early (prematurely).  Smaller than expected at birth (small for gestational age). What can I expect at the first prenatal care visit? Your first prenatal care visit will likely be the longest. You should schedule your first prenatal care visit as soon as you know that you are pregnant. Your first visit is a good time to talk about any questions or concerns you have about pregnancy. At your visit, you and your health care provider will talk about:  Your medical history, including: ? Any past pregnancies. ? Your family's medical history. ? The baby's father's medical history. ? Any long-term (chronic) health conditions you have and how you manage them. ? Any surgeries or procedures you have had. ? Any current over-the-counter or prescription medicines, herbs, or supplements  you are taking.  Other factors that could pose a risk to your baby, including:  Your home setting and your stress levels, including: ? Exposure to abuse or violence. ? Household financial strain. ? Mental health conditions you have.  Your daily health habits, including diet and exercise. Your health care provider will also:  Measure your weight, height, and blood pressure.  Do a physical exam, including a pelvic and breast exam.  Perform blood tests and urine tests to check for: ? Urinary tract infection. ? Sexually transmitted infections (STIs). ? Low iron levels in your blood (anemia). ? Blood type and certain proteins on red blood cells (Rh antibodies). ? Infections and immunity to viruses, such as hepatitis B and rubella. ? HIV (human immunodeficiency virus).  Do an ultrasound to confirm your baby's growth and development and to help predict your estimated due date (EDD). This ultrasound is done with a probe that is inserted into the vagina (transvaginal ultrasound).  Discuss your options for genetic screening.  Give you information about how to keep yourself and your baby healthy, including: ? Nutrition and taking vitamins. ? Physical activity. ? How to manage pregnancy symptoms such as nausea and vomiting (morning sickness). ? Infections and substances that may be harmful to your baby and how to avoid them. ? Food safety. ? Dental care. ? Working. ? Travel. ? Warning signs to watch for and when to call your health care provider. How often will I have prenatal care visits? After your first prenatal care visit, you will have regular visits throughout your pregnancy. The visit schedule is often as follows:  Up to week 28 of pregnancy: once every 4 weeks.  28-36 weeks: once every 2 weeks.  After 36 weeks: every week until delivery. Some women may have visits more or less often depending on any underlying health conditions and the health of the baby. Keep all follow-up  and prenatal care visits as told by your health care  provider. This is important. What happens during routine prenatal care visits? Your health care provider will:  Measure your weight and blood pressure.  Check for fetal heart sounds.  Measure the height of your uterus in your abdomen (fundal height). This may be measured starting around week 20 of pregnancy.  Check the position of your baby inside your uterus.  Ask questions about your diet, sleeping patterns, and whether you can feel the baby move.  Review warning signs to watch for and signs of labor.  Ask about any pregnancy symptoms you are having and how you are dealing with them. Symptoms may include: ? Headaches. ? Nausea and vomiting. ? Vaginal discharge. ? Swelling. ? Fatigue. ? Constipation. ? Any discomfort, including back or pelvic pain. Make a list of questions to ask your health care provider at your routine visits. What tests might I have during prenatal care visits? You may have blood, urine, and imaging tests throughout your pregnancy, such as:  Urine tests to check for glucose, protein, or signs of infection.  Glucose tests to check for a form of diabetes that can develop during pregnancy (gestational diabetes mellitus). This is usually done around week 24 of pregnancy.  An ultrasound to check your baby's growth and development and to check for birth defects. This is usually done around week 20 of pregnancy.  A test to check for group B strep (GBS) infection. This is usually done around week 36 of pregnancy.  Genetic testing. This may include blood or imaging tests, such as an ultrasound. Some genetic tests are done during the first trimester and some are done during the second trimester. What else can I expect during prenatal care visits? Your health care provider may recommend getting certain vaccines during pregnancy. These may include:  A yearly flu shot (annual influenza vaccine). This is especially  important if you will be pregnant during flu season.  Tdap (tetanus, diphtheria, pertussis) vaccine. Getting this vaccine during pregnancy can protect your baby from whooping cough (pertussis) after birth. This vaccine may be recommended between weeks 27 and 36 of pregnancy. Later in your pregnancy, your health care provider may give you information about:  Childbirth and breastfeeding classes.  Choosing a health care provider for your baby.  Umbilical cord banking.  Breastfeeding.  Birth control after your baby is born.  The hospital labor and delivery unit and how to tour it.  Registering at the hospital before you go into labor. Where to find more information  Office on Women's Health: TravelLesson.ca  American Pregnancy Association: americanpregnancy.org  March of Dimes: marchofdimes.org Summary  Prenatal care helps you and your baby stay as healthy as possible during pregnancy.  Your first prenatal care visit will most likely be the longest.  You will have visits and tests throughout your pregnancy to monitor your health and your baby's health.  Bring a list of questions to your visits to ask your health care provider.  Make sure to keep all follow-up and prenatal care visits with your health care provider. This information is not intended to replace advice given to you by your health care provider. Make sure you discuss any questions you have with your health care provider. Document Revised: 02/22/2019 Document Reviewed: 11/01/2017 Elsevier Patient Education  2020 [employer].

## 2020-09-25 NOTE — Progress Notes (Signed)
NOB- spotting, no abnormal pain that started Sunday. Was seen at [facility]

## 2020-09-26 LAB — RPR+RH+ABO+RUB AB+AB SCR+CB...
Antibody Screen: NEGATIVE
HIV Screen 4th Generation wRfx: NONREACTIVE
Hematocrit: 42.6 % (ref 34.0–46.6)
Hemoglobin: 14.4 g/dL (ref 11.1–15.9)
Hepatitis B Surface Ag: NEGATIVE
MCH: 31.8 pg (ref 26.6–33.0)
MCHC: 33.8 g/dL (ref 31.5–35.7)
MCV: 94 fL (ref 79–97)
Platelets: 278 10*3/uL (ref 150–450)
RBC: 4.53 x10E6/uL (ref 3.77–5.28)
RDW: 12.2 % (ref 11.7–15.4)
RPR Ser Ql: NONREACTIVE
Rh Factor: POSITIVE
Rubella Antibodies, IGG: 24.9 index (ref >0.99)
Varicella zoster IgG: 1487 index (ref >165)
WBC: 9.1 10*3/uL (ref 3.4–10.8)

## 2020-09-27 ENCOUNTER — Encounter: Payer: BC Managed Care – PPO | Admitting: Obstetrics and Gynecology

## 2020-09-27 LAB — CERVICOVAGINAL ANCILLARY ONLY
Chlamydia: NEGATIVE
Comment: NEGATIVE
Comment: NEGATIVE
Comment: NORMAL
Neisseria Gonorrhea: NEGATIVE
Trichomonas: NEGATIVE

## 2020-10-08 ENCOUNTER — Inpatient Hospital Stay
Admission: AD | Admit: 2020-10-08 | Discharge: 2020-10-10 | DRG: 831 | Disposition: A | Discharge status: Other Acute Inpatient Hospital | Payer: BC Managed Care – PPO | Attending: Obstetrics and Gynecology | Admitting: Obstetrics and Gynecology

## 2020-10-08 ENCOUNTER — Emergency Department: Admission: EM | Admit: 2020-10-08 | Payer: BC Managed Care – PPO | Source: Home / Self Care

## 2020-10-08 ENCOUNTER — Encounter: Payer: Self-pay | Admitting: Obstetrics and Gynecology

## 2020-10-08 ENCOUNTER — Other Ambulatory Visit: Payer: Self-pay

## 2020-10-08 ENCOUNTER — Ambulatory Visit (INDEPENDENT_AMBULATORY_CARE_PROVIDER_SITE_OTHER): Payer: BC Managed Care – PPO | Admitting: Obstetrics and Gynecology

## 2020-10-08 VITALS — BP 96/68 | Ht 67.0 in | Wt 95.8 lb

## 2020-10-08 DIAGNOSIS — O211 Hyperemesis gravidarum with metabolic disturbance: Principal | ICD-10-CM | POA: Diagnosis present

## 2020-10-08 DIAGNOSIS — R634 Abnormal weight loss: Secondary | ICD-10-CM | POA: Diagnosis present

## 2020-10-08 DIAGNOSIS — F4322 Adjustment disorder with anxiety: Secondary | ICD-10-CM | POA: Diagnosis present

## 2020-10-08 DIAGNOSIS — R1110 Vomiting, unspecified: Secondary | ICD-10-CM | POA: Diagnosis present

## 2020-10-08 DIAGNOSIS — Z3481 Encounter for supervision of other normal pregnancy, first trimester: Secondary | ICD-10-CM

## 2020-10-08 DIAGNOSIS — Z20822 Contact with and (suspected) exposure to covid-19: Secondary | ICD-10-CM | POA: Diagnosis present

## 2020-10-08 DIAGNOSIS — E43 Unspecified severe protein-calorie malnutrition: Secondary | ICD-10-CM | POA: Insufficient documentation

## 2020-10-08 DIAGNOSIS — O210 Mild hyperemesis gravidarum: Secondary | ICD-10-CM | POA: Diagnosis not present

## 2020-10-08 DIAGNOSIS — N390 Urinary tract infection, site not specified: Secondary | ICD-10-CM | POA: Diagnosis present

## 2020-10-08 DIAGNOSIS — B951 Streptococcus, group B, as the cause of diseases classified elsewhere: Secondary | ICD-10-CM | POA: Diagnosis not present

## 2020-10-08 DIAGNOSIS — Z3A09 9 weeks gestation of pregnancy: Secondary | ICD-10-CM

## 2020-10-08 DIAGNOSIS — O2511 Malnutrition in pregnancy, first trimester: Secondary | ICD-10-CM | POA: Diagnosis present

## 2020-10-08 DIAGNOSIS — O2341 Unspecified infection of urinary tract in pregnancy, first trimester: Secondary | ICD-10-CM | POA: Diagnosis not present

## 2020-10-08 DIAGNOSIS — O99341 Other mental disorders complicating pregnancy, first trimester: Secondary | ICD-10-CM | POA: Diagnosis present

## 2020-10-08 LAB — COMPREHENSIVE METABOLIC PANEL
ALT: 13 U/L (ref 0–44)
AST: 19 U/L (ref 15–41)
Albumin: 4.9 g/dL (ref 3.5–5.0)
Alkaline Phosphatase: 49 U/L (ref 38–126)
Anion gap: 13 (ref 5–15)
BUN: 9 mg/dL (ref 6–20)
CO2: 17 mmol/L — ABNORMAL LOW (ref 22–32)
Calcium: 10 mg/dL (ref 8.9–10.3)
Chloride: 102 mmol/L (ref 98–111)
Creatinine, Ser: 0.58 mg/dL (ref 0.44–1.00)
GFR, Estimated: >60 mL/min (ref >60)
Glucose, Bld: 81 mg/dL (ref 70–99)
Potassium: 4.1 mmol/L (ref 3.5–5.1)
Sodium: 132 mmol/L — ABNORMAL LOW (ref 135–145)
Total Bilirubin: 1.5 mg/dL — ABNORMAL HIGH (ref 0.3–1.2)
Total Protein: 7.6 g/dL (ref 6.5–8.1)

## 2020-10-08 LAB — CBC
HCT: 39.8 % (ref 36.0–46.0)
Hemoglobin: 14 g/dL (ref 12.0–15.0)
MCH: 32.3 pg (ref 26.0–34.0)
MCHC: 35.2 g/dL (ref 30.0–36.0)
MCV: 91.7 fL (ref 80.0–100.0)
Platelets: 246 10*3/uL (ref 150–400)
RBC: 4.34 MIL/uL (ref 3.87–5.11)
RDW: 12.2 % (ref 11.5–15.5)
WBC: 12.6 10*3/uL — ABNORMAL HIGH (ref 4.0–10.5)
nRBC: 0 % (ref 0.0–0.2)

## 2020-10-08 LAB — POCT URINALYSIS DIPSTICK OB
Glucose, UA: NEGATIVE
POC,PROTEIN,UA: NEGATIVE

## 2020-10-08 LAB — RESP PANEL BY RT-PCR (FLU A&B, COVID) ARPGX2
Influenza A by PCR: NEGATIVE
Influenza B by PCR: NEGATIVE
SARS Coronavirus 2 by RT PCR: NEGATIVE

## 2020-10-08 LAB — PHOSPHORUS: Phosphorus: 3.7 mg/dL (ref 2.5–4.6)

## 2020-10-08 LAB — MAGNESIUM: Magnesium: 2.1 mg/dL (ref 1.7–2.4)

## 2020-10-08 MED ORDER — PROMETHAZINE HCL 25 MG RE SUPP: 12.5000 mg | RECTAL | Status: DC | PRN

## 2020-10-08 MED ORDER — LACTATED RINGERS IV BOLUS
1000.0000 mL | Freq: Once | INTRAVENOUS | Status: AC
  Administered 2020-10-08: 1000 mL via INTRAVENOUS

## 2020-10-08 MED ORDER — ONDANSETRON 4 MG PO TBDP: 4.0000 mg | ORAL_TABLET | Freq: Three times a day (TID) | ORAL | Status: DC | PRN

## 2020-10-08 MED ORDER — CALCIUM CARBONATE ANTACID 500 MG PO CHEW: 2.0000 | CHEWABLE_TABLET | ORAL | Status: DC | PRN

## 2020-10-08 MED ORDER — ONDANSETRON HCL 4 MG/2ML IJ SOLN: 4.0000 mg | Freq: Three times a day (TID) | INTRAMUSCULAR | Status: DC | PRN

## 2020-10-08 MED ORDER — HYDROXYZINE HCL 50 MG/ML IM SOLN
50.0000 mg | Freq: Four times a day (QID) | INTRAMUSCULAR | Status: DC | PRN
  Filled 2020-10-08: qty 1

## 2020-10-08 MED ORDER — PROMETHAZINE HCL 25 MG PO TABS
12.5000 mg | ORAL_TABLET | ORAL | Status: DC | PRN
  Filled 2020-10-08: qty 1

## 2020-10-08 MED ORDER — HYDROXYZINE HCL 25 MG PO TABS: 50.0000 mg | ORAL_TABLET | Freq: Four times a day (QID) | ORAL | Status: DC | PRN

## 2020-10-08 MED ORDER — THIAMINE HCL 100 MG/ML IJ SOLN
INTRAVENOUS | Status: DC
  Filled 2020-10-08 (×2): qty 1000

## 2020-10-08 MED ORDER — ENSURE ENLIVE PO LIQD
237.0000 mL | Freq: Three times a day (TID) | ORAL | Status: DC
  Administered 2020-10-08: 237 mL via ORAL

## 2020-10-08 MED ORDER — THIAMINE HCL 100 MG/ML IJ SOLN
Freq: Once | INTRAVENOUS | Status: AC
  Filled 2020-10-08: qty 1000

## 2020-10-08 MED ORDER — POTASSIUM CHLORIDE 2 MEQ/ML IV SOLN
INTRAVENOUS | Status: DC
  Filled 2020-10-08 (×4): qty 1000

## 2020-10-08 MED ORDER — SODIUM CHLORIDE 0.9 % IV SOLN
8.0000 mg | Freq: Three times a day (TID) | INTRAVENOUS | Status: DC | PRN
  Filled 2020-10-08: qty 4

## 2020-10-08 NOTE — Progress Notes (Signed)
PT states she had a tooth pulled yesterday. Pt states she has not been able eat. ( nausea and vomiting )

## 2020-10-08 NOTE — H&P (Signed)
History and Physical Interval Note:  10/08/2020 4:40 PM  Penny Day  has presented today for admission related to hyperemesis gravidarum.  34 year old with Hyperemesis in pregnancy with dehydration.  Severely underweight 1. Will admit for IV fluids and  IV medications for nausea control.  2. Labs to check electrolytes 3. Unexplained 25 lbs of weight loss-  recommend consultation with nutrition as well as GI.  Recommended patient start on Ensure shakes 3 times a day.  Contacted [facility] Production designer, theatre/television/film for assistance with getting the patient ensures take through [facility].   See H&P. I have reviewed the patient's chart and labs.  Questions were answered to the patient's satisfaction.    Annamarie Major, MD, [facility], [facility] 10/08/2020  4:40 PM

## 2020-10-08 NOTE — Progress Notes (Signed)
No c/o nausea today per patient. Patient given ice chips and italian ice at request a couple hours ago, has only ate a few ice chips. Pt stated "My husband is bringing me a baked potato to try" Educated she is currently only on a clear liquid diet but would check with Dr. Tiburcio Pea who is on call- ok with Soft foods diet.

## 2020-10-08 NOTE — Progress Notes (Signed)
Pt arrived to [facility] as direct admit from [place].  Pt settled in room w/ visitor at bedside.  No c/o nausea at this time just has been sipping on water.  IV team consulted. Lab notified to draw ordered labs.   Pt updated on plan of care.

## 2020-10-08 NOTE — Progress Notes (Signed)
H&P  [name] is an 34 y.o. female.  HPI: Penny Day presents today for her regular OB visit.  She reports that she has been having difficulty with nausea and vomiting.  She has lost 9 pounds since her first visit.  She reports that prior to her pregnancy she is having difficulty with unexplained weight loss.  She reports that at her highest she weighed 120 pounds prior to the pregnancy and then dropped down to 105 before the pregnancy.  She reports that she is following with her primary care doctor for this unexplained weight loss and a cause was not determined.  She has not seen a gastroenterologist.  She reports that she can no longer tolerate dairy.  She reports that when she drinks milk or eats anything with dairy she will develop sores in her mouth.  She has not tried any Ensure shakes for weight gain.  She reports that she does qualify for [facility].  She does not tolerate swallowing pills.  For this reason she has been using Zofran which dissolves underneath the tongue.  She has not tried any B vitamins yet during this pregnancy.  She reports that she had her back right molar tooth pulled yesterday.  She reports that afterwards she vomited multiple times overnight. She feels very dehydrated.  She gets weak with standing and dizzy.  She did not tolerate food last night or today.  She has been able to keep down water today.   Past Medical History:  Diagnosis Date  . Arthritis   . Endometriosis of uterus     Past Surgical History:  Procedure Laterality Date  . LAPAROSCOPY  2011  . TONSILLECTOMY      Family History  Problem Relation Age of Onset  . Cervical cancer Mother 40  . Hypertension Mother   . Hypertension Maternal Grandmother   . Ovarian cancer Maternal Grandmother 30  . Prostate cancer Maternal Grandfather     Social History:  reports that she has never smoked. She has never used smokeless tobacco. She reports that she does not drink alcohol and does not use  drugs.  Allergies:  Allergies  Allergen Reactions  . Sulfa Antibiotics Hives  . Sumatriptan Succinate Nausea Only and Other (See Comments)    Other Reaction: OTHER REACTION-SOB, VOMI    Medications: I have reviewed the patient's current medications.  No results found for this or any previous visit (from the past 48 hour(s)).  No results found.  Review of Systems  Constitutional: Negative for chills and fever.  HENT: Negative for congestion, hearing loss and sinus pain.   Respiratory: Negative for cough, shortness of breath and wheezing.   Cardiovascular: Negative for chest pain, palpitations and leg swelling.  Gastrointestinal: Positive for nausea and vomiting. Negative for abdominal pain, constipation and diarrhea.  Genitourinary: Negative for dysuria, flank pain, frequency, hematuria and urgency.  Musculoskeletal: Negative for back pain.  Skin: Negative for rash.  Neurological: Negative for dizziness and headaches.  Psychiatric/Behavioral: Negative for suicidal ideas. The patient is not nervous/anxious.    Blood pressure 96/68, height 5\' 7"  (1.702 m), weight 95 lb 12.8 oz (43.5 kg), last menstrual period 08/01/2020. Physical Exam Vitals and nursing note reviewed.  Constitutional:      Appearance: She is well-developed. She is ill-appearing.  HENT:     Head: Normocephalic and atraumatic.     Mouth/Throat:     Mouth: Mucous membranes are dry.  Cardiovascular:     Rate and Rhythm: Normal rate and regular  rhythm.  Pulmonary:     Effort: Pulmonary effort is normal.     Breath sounds: Normal breath sounds.  Abdominal:     General: Bowel sounds are normal.     Palpations: Abdomen is soft. There is no mass.     Tenderness: There is no abdominal tenderness.  Musculoskeletal:        General: Normal range of motion.  Skin:    General: Skin is warm and dry.     Coloration: Skin is jaundiced.  Neurological:     Mental Status: She is alert and oriented to person, place, and  time.  Psychiatric:        Behavior: Behavior normal.        Thought Content: Thought content normal.        Judgment: Judgment normal.    Bedside transvaginal ultrasound showed fetal heart tones in the 160s. Dr. Bonney Aid was bedside chaperone.  Assessment/Plan: 34 year old with Hyperemesis in pregnancy with dehydration.  Severely underweight 1. Will admit for IV fluids and  IV medications for nausea control.  2. Labs to check electrolytes 3. Unexplained 25 lbs of weight loss-  recommend consultation with nutrition as well as GI.  Recommended patient start on Ensure shakes 3 times a day.  Contacted [facility] Production designer, theatre/television/film for assistance with getting the patient ensures take through [facility].     Ein Rijo R Malaquias Lenker 10/08/2020, 11:14 AM

## 2020-10-08 NOTE — Patient Instructions (Addendum)
Initial steps to help :   B6 (pyridoxine) 25 mg,  3-4 times a day- 200 mg a day total Unisom (doxylamine) 25 mg at bedtime **B6 and Unisom are available as a combination prescription medications called diclegis and bonjesta  B1 (thiamin)  50-100 mg 1-2 a day-  100 mg a day total  Continue prenatal vitamin with iron and thiamin. If it is not tolerated switch to 1 mg of folic acid.  Can add medication for gastric reflux if needed.  Subsequent steps to be added to B1, B6, and Unisom:  1. Antihistamine (one of the following medications) Dramamine      25-50 mg every 4-6 hours Benadryl      25-50 mg every 4-6 hours Meclizine      25 mg every 6 hours  2. Dopamine Antagonist (one of the following medications) Metoclopramide  (Reglan)  5-10 mg every 6-8 hours         PO Promethazine   (Phenergan)   12.5-25 mg every 4-6 hours      PO or rectal Prochlorperazine  (Compazine)  5-10 mg every 6-8 hours     25mg BID rectally   Subsequent steps if there has still not been improvement in symptoms:  3. Daily stool softner:  Colace 100 mg twice a day  4. Ondansetron  (Zofran)   4-8 mg every 6-8 hours    Hyperemesis Gravidarum Hyperemesis gravidarum is a severe form of nausea and vomiting that happens during pregnancy. Hyperemesis is worse than morning sickness. It may cause you to have nausea or vomiting all day for many days. It may keep you from eating and drinking enough food and liquids, which can lead to dehydration, malnutrition, and weight loss. Hyperemesis usually occurs during the first half (the first 20 weeks) of pregnancy. It often goes away once a woman is in her second half of pregnancy. However, sometimes hyperemesis continues through an entire pregnancy. What are the causes? The cause of this condition is not known. It may be related to changes in chemicals (hormones) in the body during pregnancy, such as the high level of pregnancy hormone (human chorionic gonadotropin) or the increase  in the female sex hormone (estrogen). What are the signs or symptoms? Symptoms of this condition include:  Nausea that does not go away.  Vomiting that does not allow you to keep any food down.  Weight loss.  Body fluid loss (dehydration).  Having no desire to eat, or not liking food that you have previously enjoyed. How is this diagnosed? This condition may be diagnosed based on:  A physical exam.  Your medical history.  Your symptoms.  Blood tests.  Urine tests. How is this treated? This condition is managed by controlling symptoms. This may include:  Following an eating plan. This can help lessen nausea and vomiting.  Taking prescription medicines. An eating plan and medicines are often used together to help control symptoms. If medicines do not help relieve nausea and vomiting, you may need to receive fluids through an IV at the hospital. Follow these instructions at home: Eating and drinking   Avoid the following: ? Drinking fluids with meals. Try not to drink anything during the 30 minutes before and after your meals. ? Drinking more than 1 cup of fluid at a time. ? Eating foods that trigger your symptoms. These may include spicy foods, coffee, high-fat foods, very sweet foods, and acidic foods. ? Skipping meals. Nausea can be more intense on an empty stomach. If   If you cannot tolerate food, do not force it. Try sucking on ice chips or other frozen items and make up for missed calories later. ? Lying down within 2 hours after eating. ? Being exposed to environmental triggers. These may include food smells, smoky rooms, closed spaces, rooms with strong smells, warm or humid places, overly loud and noisy rooms, and rooms with motion or flickering lights. Try eating meals in a well-ventilated area that is free of strong smells. ? Quick and sudden changes in your movement. ? Taking iron pills and multivitamins that contain iron. If you take prescription iron pills,  do not stop taking them unless your health care provider approves. ? Preparing food. The smell of food can spoil your appetite or trigger nausea.  To help relieve your symptoms: ? Listen to your body. Everyone is different and has different preferences. Find what works best for you. ? Eat and drink slowly. ? Eat 5-6 small meals daily instead of 3 large meals. Eating small meals and snacks can help you avoid an empty stomach. ? In the morning, before getting out of bed, eat a couple of crackers to avoid moving around on an empty stomach. ? Try eating starchy foods as these are usually tolerated well. Examples include cereal, toast, bread, potatoes, pasta, rice, and pretzels. ? Include at least 1 serving of protein with your meals and snacks. Protein options include lean meats, poultry, seafood, beans, nuts, nut butters, eggs, cheese, and yogurt. ? Try eating a protein-rich snack before bed. Examples of a protein-rick snack include cheese and crackers or a peanut butter sandwich made with 1 slice of whole-wheat bread and 1 tsp (5 g) of peanut butter. ? Eat or suck on things that have ginger in them. It may help relieve nausea. Add  tsp ground ginger to hot tea or choose ginger tea. ? Try drinking 100% fruit juice or an electrolyte drink. An electrolyte drink contains sodium, potassium, and chloride. ? Drink fluids that are cold, clear, and carbonated or sour. Examples include lemonade, ginger ale, lemon-lime soda, ice water, and sparkling water. ? Brush your teeth or use a mouth rinse after meals. ? Talk with your health care provider about starting a supplement of vitamin B6. General instructions  Take over-the-counter and prescription medicines only as told by your health care provider.  Follow instructions from your health care provider about eating or drinking restrictions.  Continue to take your prenatal vitamins as told by your health care provider. If you are having trouble taking your  prenatal vitamins, talk with your health care provider about different options.  Keep all follow-up and pre-birth (prenatal) visits as told by your health care provider. This is important. Contact a health care provider if:  You have pain in your abdomen.  You have a severe headache.  You have vision problems.  You are losing weight.  You feel weak or dizzy. Get help right away if:  You cannot drink fluids without vomiting.  You vomit blood.  You have constant nausea and vomiting.  You are very weak.  You faint.  You have a fever and your symptoms suddenly get worse. Summary  Hyperemesis gravidarum is a severe form of nausea and vomiting that happens during pregnancy.  Making some changes to your eating habits may help relieve nausea and vomiting.  This condition may be managed with medicine.  If medicines do not help relieve nausea and vomiting, you may need to receive fluids through an IV at the  hospital. This information is not intended to replace advice given to you by your health care provider. Make sure you discuss any questions you have with your health care provider. Document Revised: 11/22/2017 Document Reviewed: 07/01/2016 Elsevier Patient Education  2020 [employer].    First Trimester of Pregnancy The first trimester of pregnancy is from week 1 until the end of week 13 (months 1 through 3). A week after a sperm fertilizes an egg, the egg will implant on the wall of the uterus. This embryo will begin to develop into a baby. Genes from you and your partner will form the baby. The female genes will determine whether the baby will be a boy or a girl. At 6-8 weeks, the eyes and face will be formed, and the heartbeat can be seen on ultrasound. At the end of 12 weeks, all the baby's organs will be formed. Now that you are pregnant, you will want to do everything you can to have a healthy baby. Two of the most important things are to get good prenatal care and to  follow your health care provider's instructions. Prenatal care is all the medical care you receive before the baby's birth. This care will help prevent, find, and treat any problems during the pregnancy and childbirth. Body changes during your first trimester Your body goes through many changes during pregnancy. The changes vary from woman to woman.  You may gain or lose a couple of pounds at first.  You may feel sick to your stomach (nauseous) and you may throw up (vomit). If the vomiting is uncontrollable, call your health care provider.  You may tire easily.  You may develop headaches that can be relieved by medicines. All medicines should be approved by your health care provider.  You may urinate more often. Painful urination may mean you have a bladder infection.  You may develop heartburn as a result of your pregnancy.  You may develop constipation because certain hormones are causing the muscles that push stool through your intestines to slow down.  You may develop hemorrhoids or swollen veins (varicose veins).  Your breasts may begin to grow larger and become tender. Your nipples may stick out more, and the tissue that surrounds them (areola) may become darker.  Your gums may bleed and may be sensitive to brushing and flossing.  Dark spots or blotches (chloasma, mask of pregnancy) may develop on your face. This will likely fade after the baby is born.  Your menstrual periods will stop.  You may have a loss of appetite.  You may develop cravings for certain kinds of food.  You may have changes in your emotions from day to day, such as being excited to be pregnant or being concerned that something may go wrong with the pregnancy and baby.  You may have more vivid and strange dreams.  You may have changes in your hair. These can include thickening of your hair, rapid growth, and changes in texture. Some women also have hair loss during or after pregnancy, or hair that feels  dry or thin. Your hair will most likely return to normal after your baby is born. What to expect at prenatal visits During a routine prenatal visit:  You will be weighed to make sure you and the baby are growing normally.  Your blood pressure will be taken.  Your abdomen will be measured to track your baby's growth.  The fetal heartbeat will be listened to between weeks 10 and 14 of your pregnancy.  Test results from any previous visits will be discussed. Your health care provider may ask you:  How you are feeling.  If you are feeling the baby move.  If you have had any abnormal symptoms, such as leaking fluid, bleeding, severe headaches, or abdominal cramping.  If you are using any tobacco products, including cigarettes, chewing tobacco, and electronic cigarettes.  If you have any questions. Other tests that may be performed during your first trimester include:  Blood tests to find your blood type and to check for the presence of any previous infections. The tests will also be used to check for low iron levels (anemia) and protein on red blood cells (Rh antibodies). Depending on your risk factors, or if you previously had diabetes during pregnancy, you may have tests to check for high blood sugar that affects pregnant women (gestational diabetes).  Urine tests to check for infections, diabetes, or protein in the urine.  An ultrasound to confirm the proper growth and development of the baby.  Fetal screens for spinal cord problems (spina bifida) and Down syndrome.  HIV (human immunodeficiency virus) testing. Routine prenatal testing includes screening for HIV, unless you choose not to have this test.  You may need other tests to make sure you and the baby are doing well. Follow these instructions at home: Medicines  Follow your health care provider's instructions regarding medicine use. Specific medicines may be either safe or unsafe to take during pregnancy.  Take a  prenatal vitamin that contains at least 600 micrograms (mcg) of folic acid.  If you develop constipation, try taking a stool softener if your health care provider approves. Eating and drinking   Eat a balanced diet that includes fresh fruits and vegetables, whole grains, good sources of protein such as meat, eggs, or tofu, and low-fat dairy. Your health care provider will help you determine the amount of weight gain that is right for you.  Avoid raw meat and uncooked cheese. These carry germs that can cause birth defects in the baby.  Eating four or five small meals rather than three large meals a day may help relieve nausea and vomiting. If you start to feel nauseous, eating a few soda crackers can be helpful. Drinking liquids between meals, instead of during meals, also seems to help ease nausea and vomiting.  Limit foods that are high in fat and processed sugars, such as fried and sweet foods.  To prevent constipation: ? Eat foods that are high in fiber, such as fresh fruits and vegetables, whole grains, and beans. ? Drink enough fluid to keep your urine clear or pale yellow. Activity  Exercise only as directed by your health care provider. Most women can continue their usual exercise routine during pregnancy. Try to exercise for 30 minutes at least 5 days a week. Exercising will help you: ? Control your weight. ? Stay in shape. ? Be prepared for labor and delivery.  Experiencing pain or cramping in the lower abdomen or lower back is a good sign that you should stop exercising. Check with your health care provider before continuing with normal exercises.  Try to avoid standing for long periods of time. Move your legs often if you must stand in one place for a long time.  Avoid heavy lifting.  Wear low-heeled shoes and practice good posture.  You may continue to have sex unless your health care provider tells you not to. Relieving pain and discomfort  Wear a good support bra to  relieve breast  tenderness.  Take warm sitz baths to soothe any pain or discomfort caused by hemorrhoids. Use hemorrhoid cream if your health care provider approves.  Rest with your legs elevated if you have leg cramps or low back pain.  If you develop varicose veins in your legs, wear support hose. Elevate your feet for 15 minutes, 3-4 times a day. Limit salt in your diet. Prenatal care  Schedule your prenatal visits by the twelfth week of pregnancy. They are usually scheduled monthly at first, then more often in the last 2 months before delivery.  Write down your questions. Take them to your prenatal visits.  Keep all your prenatal visits as told by your health care provider. This is important. Safety  Wear your seat belt at all times when driving.  Make a list of emergency phone numbers, including numbers for family, friends, the hospital, and police and fire departments. General instructions  Ask your health care provider for a referral to a local prenatal education class. Begin classes no later than the beginning of month 6 of your pregnancy.  Ask for help if you have counseling or nutritional needs during pregnancy. Your health care provider can offer advice or refer you to specialists for help with various needs.  Do not use hot tubs, steam rooms, or saunas.  Do not douche or use tampons or scented sanitary pads.  Do not cross your legs for long periods of time.  Avoid cat litter boxes and soil used by cats. These carry germs that can cause birth defects in the baby and possibly loss of the fetus by miscarriage or stillbirth.  Avoid all smoking, herbs, alcohol, and medicines not prescribed by your health care provider. Chemicals in these products affect the formation and growth of the baby.  Do not use any products that contain nicotine or tobacco, such as cigarettes and e-cigarettes. If you need help quitting, ask your health care provider. You may receive counseling support  and other resources to help you quit.  Schedule a dentist appointment. At home, brush your teeth with a soft toothbrush and be gentle when you floss. Contact a health care provider if:  You have dizziness.  You have mild pelvic cramps, pelvic pressure, or nagging pain in the abdominal area.  You have persistent nausea, vomiting, or diarrhea.  You have a bad smelling vaginal discharge.  You have pain when you urinate.  You notice increased swelling in your face, hands, legs, or ankles.  You are exposed to fifth disease or chickenpox.  You are exposed to [place] measles (rubella) and have never had it. Get help right away if:  You have a fever.  You are leaking fluid from your vagina.  You have spotting or bleeding from your vagina.  You have severe abdominal cramping or pain.  You have rapid weight gain or loss.  You vomit blood or material that looks like coffee grounds.  You develop a severe headache.  You have shortness of breath.  You have any kind of trauma, such as from a fall or a car accident. Summary  The first trimester of pregnancy is from week 1 until the end of week 13 (months 1 through 3).  Your body goes through many changes during pregnancy. The changes vary from woman to woman.  You will have routine prenatal visits. During those visits, your health care provider will examine you, discuss any test results you may have, and talk with you about how you are feeling. This information is  not intended to replace advice given to you by your health care provider. Make sure you discuss any questions you have with your health care provider. Document Revised: 10/15/2017 Document Reviewed: 10/14/2016 Elsevier Patient Education  2020 [employer].

## 2020-10-09 DIAGNOSIS — R634 Abnormal weight loss: Secondary | ICD-10-CM

## 2020-10-09 DIAGNOSIS — O99341 Other mental disorders complicating pregnancy, first trimester: Secondary | ICD-10-CM | POA: Diagnosis present

## 2020-10-09 DIAGNOSIS — R1110 Vomiting, unspecified: Secondary | ICD-10-CM | POA: Diagnosis present

## 2020-10-09 DIAGNOSIS — O210 Mild hyperemesis gravidarum: Secondary | ICD-10-CM | POA: Diagnosis present

## 2020-10-09 DIAGNOSIS — O2341 Unspecified infection of urinary tract in pregnancy, first trimester: Secondary | ICD-10-CM | POA: Diagnosis present

## 2020-10-09 DIAGNOSIS — O211 Hyperemesis gravidarum with metabolic disturbance: Secondary | ICD-10-CM | POA: Diagnosis present

## 2020-10-09 DIAGNOSIS — E43 Unspecified severe protein-calorie malnutrition: Secondary | ICD-10-CM | POA: Insufficient documentation

## 2020-10-09 DIAGNOSIS — Z20822 Contact with and (suspected) exposure to covid-19: Secondary | ICD-10-CM | POA: Diagnosis present

## 2020-10-09 DIAGNOSIS — R112 Nausea with vomiting, unspecified: Secondary | ICD-10-CM

## 2020-10-09 DIAGNOSIS — O2510 Malnutrition in pregnancy, unspecified trimester: Secondary | ICD-10-CM | POA: Diagnosis present

## 2020-10-09 DIAGNOSIS — F4322 Adjustment disorder with anxiety: Secondary | ICD-10-CM | POA: Diagnosis present

## 2020-10-09 DIAGNOSIS — Z3A00 Weeks of gestation of pregnancy not specified: Secondary | ICD-10-CM | POA: Diagnosis not present

## 2020-10-09 DIAGNOSIS — B951 Streptococcus, group B, as the cause of diseases classified elsewhere: Secondary | ICD-10-CM | POA: Diagnosis not present

## 2020-10-09 DIAGNOSIS — N390 Urinary tract infection, site not specified: Secondary | ICD-10-CM | POA: Diagnosis present

## 2020-10-09 DIAGNOSIS — Z3A09 9 weeks gestation of pregnancy: Secondary | ICD-10-CM | POA: Diagnosis not present

## 2020-10-09 DIAGNOSIS — O2511 Malnutrition in pregnancy, first trimester: Secondary | ICD-10-CM | POA: Diagnosis present

## 2020-10-09 MED ORDER — SODIUM CHLORIDE 0.9 % IV SOLN: 10.0000 mL | INTRAVENOUS | Status: AC

## 2020-10-09 MED ORDER — KATE FARMS STANDARD 1.4 PO LIQD
325.0000 mL | Freq: Two times a day (BID) | ORAL | Status: DC
  Filled 2020-10-09: qty 325

## 2020-10-09 MED ORDER — SODIUM CHLORIDE 0.9 % IV SOLN
1.0000 g | INTRAVENOUS | Status: DC
  Administered 2020-10-09: 1 g via INTRAVENOUS
  Filled 2020-10-09: qty 1

## 2020-10-09 MED ORDER — SODIUM CHLORIDE 0.9 % IV SOLN: INTRAVENOUS | Status: DC | PRN

## 2020-10-09 MED ORDER — THIAMINE HCL 100 MG/ML IJ SOLN
100.0000 mg | Freq: Every day | INTRAMUSCULAR | Status: DC
  Administered 2020-10-09: 100 mg via INTRAVENOUS
  Filled 2020-10-09: qty 1

## 2020-10-09 NOTE — Consult Note (Signed)
[facility] Face-to-Face Psychiatry Consult   Reason for Consult:  Concern about anorexia vs hyperemesis gavidarum Referring Physician:  Dr Jean Rosenthal Patient Identification: Penny Day MRN:  [id number] Principal Diagnosis: Hyperemesis gravidarum Diagnosis:  Principal Problem:   Hyperemesis gravidarum Active Problems:   Adjustment disorder with anxiety   Weight loss   Intractable vomiting   Total Time spent with patient: 45 minutes  Subjective:   Penny Day is a 34 y.o. female patient admitted with N/V.  This patient seen and evaluated in person by this provider.  She reports, "feeling a lot better."  Penny Day states she has been really sick and dehydrated in the past 3 weeks.  Prior to the pregnancy she was gaining weight and gained 3 pounds by approximately 6 weeks of pregnancy.  Then she became sick and lost 10 pounds.  Similar issues in her first pregnancy and does not understand why they were concerned at that time when she was just as sick as she is now.  She is feeling better and ate popcorn chicken prior to the assessment with the wrappers on her bedside table.  Typical eating disorders of restrictive type would limit diet to healthy low-carb foods.  Not the case with this client.  Her husband is at the bedside with no concerns voiced.  Penny Day had gone to her OB/GYN appointment today and was sent here for admission once she received fluids, she began to feel better.  This is the first time she has felt hungry and has eaten something with stability in the past 3 weeks.  She does report having an allergic reaction a couple years ago which increased her anxiety and made her paranoid to eat at the time but feels like this is resolved.  Denies suicidal/homicidal ideations, past suicide attempts, hallucinations, and substance use.  Her anxiety is "so much better, just scared with being sick the past 3 weeks."  Denies any postpartum depression, mother with depression.  Chronic insomnia for  years.  Denies history of eating disorders.  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  Calm and cooperative during assessment, denies depression, low level of anxiety related to recent bout of nausea and vomiting for the past 3 weeks.  When this occurred 8 years ago she had to have fluids one time and began vomiting as soon as she returned home.  Normal pregnancy with 10 to 15 pound weight gain and healthy baby.  He is now 19 years old with ADHD and doing well in school.  No safety concerns for risk of self at this time.  It appears of her nausea is under control, there are her no issues with eating.  Referral to specialist in hyperemesis gravidarum recommended.  HP per MD:   Upon further record review, the patient has had a significant weight loss since 2018. She is noted to weight 136 pounds in 08/2017. In March 2020, she weighed 122 lb.  In June 2021, she weight 102 pounds. She states that she had a workup, which appeared to investigate celiac sprue, which was negative. She had a negative abdominal ultrasound.  She was evaluated by her PCP and no cause was found. Shortly afterward, she found out she was pregnant. She states that she had just started to gain some weight and when her pregnancy started she has become nauseated and can't keep food down. She states that  eight years ago she had the same issue in pregnancy, however, she weighed more during that pregnancy than this one. She describes herself as a picky eater and only likes certain foods.  She has had no emesis since admission, though she has tried no food or drink so far  Past Psychiatric History: anxiety  Risk to Self:  none Risk to Others:  none Prior Inpatient Therapy:  none Prior Outpatient Therapy:  none  Past Medical History:  Past Medical History:  Diagnosis Date  . Arthritis   . Endometriosis of uterus     Past Surgical  History:  Procedure Laterality Date  . LAPAROSCOPY  2011  . TONSILLECTOMY     Family History:  Family History  Problem Relation Age of Onset  . Cervical cancer Mother 64  . Hypertension Mother   . Hypertension Maternal Grandmother   . Ovarian cancer Maternal Grandmother 30  . Prostate cancer Maternal Grandfather    Family Psychiatric  History: mother with depression Social History:  Social History   Substance and Sexual Activity  Alcohol Use No     Social History   Substance and Sexual Activity  Drug Use No    Social History   Socioeconomic History  . Marital status: Married    Spouse name: Not on file  . Number of children: Not on file  . Years of education: Not on file  . Highest education level: Not on file  Occupational History  . Not on file  Tobacco Use  . Smoking status: Never Smoker  . Smokeless tobacco: Never Used  Vaping Use  . Vaping Use: Never used  Substance and Sexual Activity  . Alcohol use: No  . Drug use: No  . Sexual activity: Yes    Partners: Male    Birth control/protection: None  Other Topics Concern  . Not on file  Social History Narrative  . Not on file   Social Determinants of Health   Financial Resource Strain:   . Difficulty of Paying Living Expenses: Not on file  Food Insecurity:   . Worried About Programme researcher, broadcasting/film/video in the Last Year: Not on file  . Ran Out of Food in the Last Year: Not on file  Transportation Needs:   . Lack of Transportation (Medical): Not on file  . Lack of Transportation (Non-Medical): Not on file  Physical Activity:   . Days of Exercise per Week: Not on file  . Minutes of Exercise per Session: Not on file  Stress:   . Feeling of Stress : Not on file  Social Connections:   . Frequency of Communication with Friends and Family: Not on file  . Frequency of Social Gatherings with Friends and Family: Not on file  . Attends Religious Services: Not on file  . Active Member of Clubs or Organizations: Not on  file  . Attends Banker Meetings: Not on file  . Marital Status: Not on file   Additional Social History:    Allergies:   Allergies  Allergen Reactions  . Sulfa Antibiotics Hives  . Sumatriptan Succinate Nausea Only and Other (See Comments)    Other Reaction: OTHER REACTION-SOB, VOMI    Labs:  Results for orders placed or performed during the hospital encounter of 10/08/20 (from the past 48 hour(s))  CBC     Status: Abnormal   Collection Time: 10/08/20  2:09 PM  Result Value Ref Range   WBC 12.6 (H) 4.0 - 10.5 K/uL   RBC  4.34 3.87 - 5.11 MIL/uL   Hemoglobin 14.0 12.0 - 15.0 g/dL   HCT 01.6 36 - 46 %   MCV 91.7 80.0 - 100.0 fL   MCH 32.3 26.0 - 34.0 pg   MCHC 35.2 30.0 - 36.0 g/dL   RDW 01.0 93.2 - 35.5 %   Platelets 246 150 - 400 K/uL   nRBC 0.0 0.0 - 0.2 %    Comment: Performed at [facility], [address] [place], [place] [address]  Comprehensive metabolic panel     Status: Abnormal   Collection Time: 10/08/20  2:09 PM  Result Value Ref Range   Sodium 132 (L) 135 - 145 mmol/L   Potassium 4.1 3.5 - 5.1 mmol/L   Chloride 102 98 - 111 mmol/L   CO2 17 (L) 22 - 32 mmol/L   Glucose, Bld 81 70 - 99 mg/dL    Comment: Glucose reference range applies only to samples taken after fasting for at least 8 hours.   BUN 9 6 - 20 mg/dL   Creatinine, Ser 2.54 0.44 - 1.00 mg/dL   Calcium 27.0 8.9 - 62.3 mg/dL   Total Protein 7.6 6.5 - 8.1 g/dL   Albumin 4.9 3.5 - 5.0 g/dL   AST 19 15 - 41 U/L   ALT 13 0 - 44 U/L   Alkaline Phosphatase 49 38 - 126 U/L   Total Bilirubin 1.5 (H) 0.3 - 1.2 mg/dL   GFR, Estimated >76 >28 mL/min    Comment: (NOTE) Calculated using the CKD-EPI Creatinine Equation (2021)    Anion gap 13 5 - 15    Comment: Performed at [facility], [address], [place], [place] [address]  Magnesium     Status: None   Collection Time: 10/08/20  2:09 PM  Result Value Ref Range   Magnesium 2.1 1.7 - 2.4 mg/dL    Comment: Performed  at [facility], [address] [place], [place] [address]  Phosphorus     Status: None   Collection Time: 10/08/20  2:09 PM  Result Value Ref Range   Phosphorus 3.7 2.5 - 4.6 mg/dL    Comment: Performed at [facility], [address] [place], [place] [address]  Resp Panel by RT-PCR (Flu A&B, Covid) Nasopharyngeal Swab     Status: None   Collection Time: 10/08/20  2:58 PM   Specimen: Nasopharyngeal Swab; Nasopharyngeal(NP) swabs in vial transport medium  Result Value Ref Range   SARS Coronavirus 2 by RT PCR NEGATIVE NEGATIVE    Comment: (NOTE) SARS-CoV-2 target nucleic acids are NOT DETECTED.  The SARS-CoV-2 RNA is generally detectable in upper respiratory specimens during the acute phase of infection. The lowest concentration of SARS-CoV-2 viral copies this assay can detect is 138 copies/mL. A negative result does not preclude SARS-Cov-2 infection and should not be used as the sole basis for treatment or other patient management decisions. A negative result may occur with  improper specimen collection/handling, submission of specimen other than nasopharyngeal swab, presence of viral mutation(s) within the areas targeted by this assay, and inadequate number of viral copies(<138 copies/mL). A negative result must be combined with clinical observations, patient history, and epidemiological information. The expected result is Negative.  Fact Sheet for Patients:  BloggerCourse.com  Fact Sheet for Healthcare Providers:  SeriousBroker.it  This test is no t yet approved or cleared by the [place] FDA and  has been authorized for detection and/or diagnosis of SARS-CoV-2 by FDA under an Emergency Use  Authorization (EUA). This EUA will remain  in effect (meaning this test can be used) for the duration of the COVID-19 declaration under Section 564(b)(1) of the Act, 21 U.S.C.section 360bbb-3(b)(1), unless the  authorization is terminated  or revoked sooner.       Influenza A by PCR NEGATIVE NEGATIVE   Influenza B by PCR NEGATIVE NEGATIVE    Comment: (NOTE) The Xpert Xpress SARS-CoV-2/FLU/RSV plus assay is intended as an aid in the diagnosis of influenza from Nasopharyngeal swab specimens and should not be used as a sole basis for treatment. Nasal washings and aspirates are unacceptable for Xpert Xpress SARS-CoV-2/FLU/RSV testing.  Fact Sheet for Patients: BloggerCourse.com  Fact Sheet for Healthcare Providers: SeriousBroker.it  This test is not yet approved or cleared by the [place] FDA and has been authorized for detection and/or diagnosis of SARS-CoV-2 by FDA under an Emergency Use Authorization (EUA). This EUA will remain in effect (meaning this test can be used) for the duration of the COVID-19 declaration under Section 564(b)(1) of the Act, 21 U.S.C. section 360bbb-3(b)(1), unless the authorization is terminated or revoked.  Performed at [facility], [address] [place], [place] [address]     Current Facility-Administered Medications  Medication Dose Route Frequency Provider Last Rate Last Admin  . calcium carbonate (TUMS - dosed in mg elemental calcium) chewable tablet 400 mg of elemental calcium  2 tablet Oral Q4H PRN Schuman, Christanna R, MD      . cefTRIAXone (ROCEPHIN) 1 g in sodium chloride 0.9 % 100 mL IVPB  1 g Intravenous Q24H Conard Novak, MD      . feeding supplement (ENSURE ENLIVE / ENSURE PLUS) liquid 237 mL  237 mL Oral TID Schuman, Christanna R, MD   237 mL at 10/08/20 2200  . hydrOXYzine (ATARAX/VISTARIL) tablet 50 mg  50 mg Oral Q6H PRN Schuman, Christanna R, MD       Or  . hydrOXYzine (VISTARIL) injection 50 mg  50 mg Intramuscular Q6H PRN Schuman, Christanna R, MD      . lactated ringers 1,000 mL with potassium chloride 20 mEq infusion   Intravenous Continuous Schuman, Christanna R,  MD 75 mL/hr at 10/09/20 1420 Rate Verify at 10/09/20 1420   And  . lactated ringers 1,000 mL with thiamine 100 mg, folic acid 0.6 mg, multivitamins adult 10 mL, potassium chloride 20 mEq infusion   Intravenous Q24H Natale Milch, MD   Held at 10/09/20 1415  . ondansetron (ZOFRAN-ODT) disintegrating tablet 4-8 mg  4-8 mg Oral Q8H PRN Schuman, Christanna R, MD       Or  . ondansetron (ZOFRAN) injection 4 mg  4 mg Intravenous Q8H PRN Schuman, Christanna R, MD       Or  . ondansetron (ZOFRAN) 8 mg in sodium chloride 0.9 % 50 mL IVPB  8 mg Intravenous Q8H PRN Schuman, Christanna R, MD      . promethazine (PHENERGAN) tablet 12.5-25 mg  12.5-25 mg Oral Q4H PRN Schuman, Christanna R, MD       Or  . promethazine (PHENERGAN) suppository 12.5-25 mg  12.5-25 mg Rectal Q4H PRN Schuman, Jaquelyn Bitter, MD        Musculoskeletal: Strength & Muscle Tone: decreased Gait & Station: normal Patient leans: N/A  Psychiatric Specialty Exam: Physical Exam Vitals and nursing note reviewed.  Constitutional:      Appearance: Normal appearance.  HENT:     Head: Normocephalic.     Nose: Nose normal.  Pulmonary:  Effort: Pulmonary effort is normal.  Musculoskeletal:        General: Normal range of motion.     Cervical back: Normal range of motion.  Neurological:     General: No focal deficit present.     Mental Status: She is alert and oriented to person, place, and time.  Psychiatric:        Attention and Perception: Attention and perception normal.        Mood and Affect: Mood is anxious.        Behavior: Behavior normal. Behavior is cooperative.        Thought Content: Thought content normal.        Cognition and Memory: Cognition and memory normal.        Judgment: Judgment normal.     Review of Systems  Psychiatric/Behavioral: The patient is nervous/anxious.   All other systems reviewed and are negative.   Blood pressure 96/60, pulse 87, temperature 98.4 F (36.9 C), temperature  source Oral, resp. rate 20, weight 44.1 kg, last menstrual period 08/01/2020, SpO2 98 %.Body mass index is 15.23 kg/m.  General Appearance: Casual  Eye Contact:  Good  Speech:  Normal Rate  Volume:  Normal  Mood:  Anxious  Affect:  Congruent  Thought Process:  Coherent and Descriptions of Associations: Intact  Orientation:  Full (Time, Place, and Person)  Thought Content:  WDL and Logical  Suicidal Thoughts:  No  Homicidal Thoughts:  No  Memory:  Immediate;   Good Recent;   Good Remote;   Good  Judgement:  Good  Insight:  Good  Psychomotor Activity:  Decreased  Concentration:  Concentration: Good and Attention Span: Good  Recall:  Good  Fund of Knowledge:  Good  Language:  Good  Akathisia:  No  Handed:  Right  AIMS (if indicated):     Assets:  Housing Intimacy Leisure Time Physical Health Resilience Social Support  ADL's:  Intact  Cognition:  WNL  Sleep:        Treatment Plan Summary: Adjustment disorder with anxious mood: -Mindfulness exercises to assist periods of anxiety  Hyperemesis gravidarum: -Refer to specialist for a thorough evaluation  Disposition: No evidence of imminent risk to self or others at present.   Patient does not meet criteria for psychiatric inpatient admission.  Nanine MeansJamison Imogine Carvell, NP 10/09/2020 4:28 PM

## 2020-10-09 NOTE — Progress Notes (Addendum)
Daily Benign Gynecology Progress Note Penny Day  [id number]  HD#2  Chief Complaint: Care for hyperemesis gravidarum  Subjective:  Overnight Events: No acute events Complaints: none currently She denies: fevers, chills, chest pain, trouble breathing, nausea, vomiting, severe abdominal pain.  She has tolerated: some small amount of liquids She is ambulating and is voiding.  Upon further record review, the patient has had a significant weight loss since 2018. She is noted to weight 136 pounds in 08/2017. In March 2020, she weighed 122 lb.  In June 2021, she weight 102 pounds. She states that she had a workup, which appeared to investigate celiac sprue, which was negative. She had a negative abdominal ultrasound.  She was evaluated by her PCP and no cause was found. Shortly afterward, she found out she was pregnant. She states that she had just started to gain some weight and when her pregnancy started she has become nauseated and can't keep food down. She states that eight years ago she had the same issue in pregnancy, however, she weighed more during that pregnancy than this one. She describes herself as a picky eater and only likes certain foods.  She has had no emesis since admission, though she has tried no food or drink so far.   She does state that she feels much better after being rehydrated. She was diagnosed with a  UTI on 09/27/20 and was prescribed an antibiotic for GBS UTI, which was asymptomatic. She has not taken this antibiotics according to her.   She denies vaginal bleeding currently. She did have an episode of vaginal bleeding earlier in her pregnancy. She has had two ultrasound since that time that have been reassuring.  Objective:  Most recent vitals Temp: 98.3 F (36.8 C)  BP: 102/64  Pulse Rate: 71  Resp: 18  SpO2: 99 %   Vitals Range over 24 hours Temp  Avg: 98.4 F (36.9 C)  Min: 98.3 F (36.8 C)  Max: 98.6 F (37 C) BP  Min: 95/57  Max: 107/74 Pulse  Avg: 73.8   Min: 71  Max: 77 SpO2  Avg: 99.3 %  Min: 99 %  Max: 100 %   Physical Exam Physical Exam Constitutional:      General: She is not in acute distress.    Appearance: She is underweight.  HENT:     Head: Normocephalic and atraumatic.  Cardiovascular:     Rate and Rhythm: Normal rate and regular rhythm.     Heart sounds: No murmur heard.  No friction rub. No gallop.   Pulmonary:     Effort: Pulmonary effort is normal. No respiratory distress.     Breath sounds: Normal breath sounds. No wheezing, rhonchi or rales.  Abdominal:     General: There is no distension.     Palpations: Abdomen is soft. There is no mass.     Tenderness: There is no abdominal tenderness. There is no guarding or rebound.  Musculoskeletal:        General: No swelling. Normal range of motion.  Neurological:     General: No focal deficit present.     Mental Status: She is alert and oriented to person, place, and time.     Cranial Nerves: No cranial nerve deficit.  Skin:    General: Skin is warm and dry.     Findings: No erythema.  Psychiatric:        Mood and Affect: Mood normal.        Behavior: Behavior normal.  Behavior is cooperative.        Judgment: Judgment normal.      AM Labs Lab Results  Component Value Date   WBC 12.6 (H) 10/08/2020   HGB 14.0 10/08/2020   HCT 39.8 10/08/2020   PLT 246 10/08/2020   NA 132 (L) 10/08/2020   K 4.1 10/08/2020   CREATININE 0.58 10/08/2020   BUN 9 10/08/2020     Assessment:  Penny Day is a 34 y.o. female HD#2 admitted with hyperemesis gravidarum. She also has an unexplained weight loss with no identifiable etiology.      Plan:  Unexplained weight loss: Gastroenterology consult, Registered nutritionist consult.  I will defer to gastroenterology regarding the work up needed for this issue. More recently, she has lost some weight likely attributable to her nausea.  I am concerned about her as she is starting pregnancy with a very low BMI (15.23 kg/m^2).   We discussed the importance of nutrition and health weight gain in pregnancy. We also discussed that she may have to take medication in order to be able to have adequate nutrition and weight gain for a normal, health pregnancy.  Continue to monitor very closely and will follow recs from GI and Nutrition. GBS UTI: Will treat IV since she is not taking PO medications at this time.  Hyperemesis: IVF hydration at this point with thiamine daily x 3 days.  Will add medication per GI and the standard treatment recommended in pregnancy.   DVT Prophylaxis:  Ambulation, SCDs if not ambulating Activity: as tolerated Anticipated Discharge: tomorrow or the next day  Thomasene Mohair, MD  10/09/2020 2:58 PM   ADDENDUM: Registered Dietician has evaluated patient and states that she meets criteria for severe malnutrition.  It has been recommended that as the patient advances her diet, there is high risk for refeeding syndrome and if she is unable to advanced orally, she might require a small-bore feeding tube to give her nutrition, still with the requirement to watch for refeeding syndrome.  I am highly concerned that there may be a psychiatric component to her nutritional status and how she reached this status. I have placed a consultation to psychiatry.  This patient appears to be at very high risk from a nutritional standpoint and, given her pregnancy, might require transfer to a higher level of care.  Recommend watch potassium, phosphorus, and magnesium as she does advance her diet and replete as necessary.  I have also reached out to MFM.  Will consider a transfer to [facility], if appropriate.

## 2020-10-09 NOTE — Progress Notes (Signed)
Nutrition Status: Nutrition Problem: Severe Malnutrition Etiology: social / environmental circumstances (food aversions/restrictions, inadequate oral intake) Signs/Symptoms: severe fat depletion, severe muscle depletion Interventions: Refer to RD note for recommendations   Thomasene Mohair, MD, [facility], [facility] 10/09/2020 4:57 PM

## 2020-10-09 NOTE — Consult Note (Signed)
Midge Minium, MD [facility]  [address] [place], [place] [address] Phone: [phone] Fax : [phone]  Consultation  Referring Provider:     Dr. Jean Rosenthal Primary Care Physician:  Patient, No Pcp Per Primary Gastroenterologist:  Gentry Fitz         Reason for Consultation:     Weight loss  Date of Admission:  10/08/2020 Date of Consultation:  10/09/2020         HPI:   Penny Day is a 34 y.o. female Who has a history of having a significant weight loss over the last 2 years.  The patient states that she was over 130 pounds back in 2018 and had trouble with her last pregnancy resulting in vomiting and weight loss.  The patient continued to lose weight and was reported to have a weight of 122 pounds in June 2021.  She did not see gastrology for this weight loss but her primary care provider was checking labs for possible causes and did a test for celiac sprue which was negative.  The patient recently had a urine sample sent off that showed group B strep and was given antibiotics.  The patient states that she has aversion to taking pills and was given a liquid but did not take it.  The patient is joint by her husband today who gives some of the history.  It appears that the patient has an aversion to many different foods and also is in fear of eating many different types of food because of a allergic reaction she had in the past but is not sure what she was allergic to.  She does drink Gatorade quite regularly and reports that she likes chicken.  There is no report of any abdominal pain, bloating, diarrhea or constipation.  She states that a lot of times she will eating get nauseous.  She does not reported to be any worse with greasy or fatty foods.  The patient endorses not having any vomiting since being admitted to the hospital. The patient denies any history of any eating disorders in the past or trying to lose weight.  She also states that she started to gain weight before she  got pregnant and is now [redacted] weeks pregnant. She does attribute a lot of her symptoms to anxiety.  There is no report of any hematemesis.The patient also denies any central symptoms such as dizziness bloody vision headaches or neck pain. The patient's liver enzymes were checked and her bilirubin was elevated at 1.5 but the patient denies any dark urine or yellowing of her skin or sclera.  Past Medical History:  Diagnosis Date  . Arthritis   . Endometriosis of uterus     Past Surgical History:  Procedure Laterality Date  . LAPAROSCOPY  2011  . TONSILLECTOMY      Prior to Admission medications   Medication Sig Start Date End Date Taking? Authorizing Provider  ondansetron (ZOFRAN ODT) 4 MG disintegrating tablet Take 1 tablet (4 mg total) by mouth every 6 (six) hours as needed for nausea. 09/25/20   Tresea Mall, CNM    Family History  Problem Relation Age of Onset  . Cervical cancer Mother 60  . Hypertension Mother   . Hypertension Maternal Grandmother   . Ovarian cancer Maternal Grandmother 30  . Prostate cancer Maternal Grandfather      Social History   Tobacco Use  . Smoking status: Never Smoker  . Smokeless tobacco: Never Used  Vaping Use  .  Vaping Use: Never used  Substance Use Topics  . Alcohol use: No  . Drug use: No    Allergies as of 10/08/2020 - Review Complete 10/08/2020  Allergen Reaction Noted  . Sulfa antibiotics Hives 11/18/2015  . Sumatriptan succinate Nausea Only and Other (See Comments)     Review of Systems:    All systems reviewed and negative except where noted in HPI.   Physical Exam:  Vital signs in last 24 hours: Temp:  [98.3 F (36.8 C)-98.6 F (37 C)] 98.3 F (36.8 C) (11/24 1129) Pulse Rate:  [71-75] 71 (11/24 1129) Resp:  [18-20] 18 (11/24 1129) BP: (95-102)/(56-64) 102/64 (11/24 1129) SpO2:  [99 %-100 %] 99 % (11/24 1129) Weight:  [44.1 kg] 44.1 kg (11/24 0500) Last BM Date: 10/06/20 General:   Pleasant, cooperative in NAD Head:   Normocephalic and atraumatic. Eyes:   No icterus.   Conjunctiva pink. PERRLA. Ears:  Normal auditory acuity. Neck:  Supple; no masses or thyroidomegaly Lungs: Respirations even and unlabored. Lungs clear to auscultation bilaterally.   No wheezes, crackles, or rhonchi.  Heart:  Regular rate and rhythm;  Without murmur, clicks, rubs or gallops Abdomen:  Soft, nondistended, nontender. Normal bowel sounds. No appreciable masses or hepatomegaly.  No rebound or guarding.  Rectal:  Not performed. Msk:  Symmetrical without gross deformities.  Extremities:  Without edema, cyanosis or clubbing. Neurologic:  Alert and oriented x3;  grossly normal neurologically. Skin:  Intact without significant lesions or rashes. Cervical Nodes:  No significant cervical adenopathy. Psych:  Alert and cooperative. Normal affect.  LAB RESULTS: Recent Labs    10/08/20 1409  WBC 12.6*  HGB 14.0  HCT 39.8  PLT 246   BMET Recent Labs    10/08/20 1409  NA 132*  K 4.1  CL 102  CO2 17*  GLUCOSE 81  BUN 9  CREATININE 0.58  CALCIUM 10.0   LFT Recent Labs    10/08/20 1409  PROT 7.6  ALBUMIN 4.9  AST 19  ALT 13  ALKPHOS 49  BILITOT 1.5*   PT/INR No results for input(s): LABPROT, INR in the last 72 hours.  STUDIES: No results found.    Impression / Plan:   Assessment: Active Problems:   Hyperemesis gravidarum   Penny Day is a 34 y.o. y/o female with a history of hyperemesis gravidarum who is now admitted with a history of 2 years of weight loss. The patient had just started to have increasing weight prior to getting pregnant.  She states that she has had no further vomiting since admission. The patient was also diagnosed with urinary tract infection for which she was not treated due to her not taking the medication she was prescribed.  The patient does not have any obvious GI source with her denial of any change in bowel habits, bloating or anemia.  The patient's sprue antibodies were  negative and she does not present as a patient with malabsorption or maldigestion.  Plan:  I agree with the patient having a consult with nutritional services.  I would advance the diet as tolerated.  The patient may have silent reflux which can cause nausea although I do not disagree with the patient that anxiety and food aversion may be at play here.  The patient's husband states that there are many food she just doesn't like eating.  I have told the patient to try to drink small amounts of ensure throughout the day to try to gain weight and keep her  nutritional status up.  She has also been told to avoid foods with empty calories that have no nutritional benefit for her and her fetus.  She has also been encouraged to try and consume the foods that she likes so that she can also use this to gain weight.  The patient has been explained the plan and agrees with it.  Thank you for involving me in the care of this patient.      LOS: 0 days   Midge Minium, MD, [facility] 10/09/2020, 3:26 PM,  Pager [phone] 7am-5pm  Check AMION for 5pm -7am coverage and on weekends   Note: This dictation was prepared with Dragon dictation along with smaller phrase technology. Any transcriptional errors that result from this process are unintentional.

## 2020-10-09 NOTE — Discharge Summary (Addendum)
DC Summary Discharge Summary   Patient ID: Penny Day [id number] 34 y.o. 11-11-1986  Admit date: 10/08/2020  Discharge date: 10/09/2020  Principal Diagnoses:  Hyperemesis Gravidarum [O21.0] Protein-calorie malnutrition, severe [E.43] Weight loss [R63.4] Intractable vomiting [R11.10]  Secondary Diagnoses:  Adjustment disorder with anxiety [F43.22]  Procedures performed during the hospitalization:  1) Nutrition/dietician consultation 2) Gastroenterology consultation 3) Psychiatry consultation 4) Treatment of dehydration 5) Management of nausea 6) Lab work to determine electrolyte abnormalities associated with her conditions  HPI: 34 y.o. G73P1011 female at [redacted]w[redacted]d who presented to clinic for a routine prenatal visit. She reported that she has been having difficulty with nausea and vomiting. She reports that she had her back right molar tooth pulled on the day prior to admission.  She reports that afterwards she vomited multiple times overnight. She feels very dehydrated.  She gets weak with standing and dizzy.  She did not tolerate food last night or today.  She has been able to keep down water today.  The patient has had a significant weight loss since 2018. She is noted to weigh 136 pounds in 08/2017. In March 2020, she weighed 122 lb.  In June 2021, she weight 102 pounds. She states that she had a workup, which appeared to investigate celiac sprue, which was negative. She had a negative abdominal ultrasound.  She was evaluated by her PCP and no cause was found. Shortly afterward, she found out she was pregnant. She states that she had just started to gain some weight and when her pregnancy started she has become nauseated and can't keep food down. She states that eight years ago she had the same issue in pregnancy, however, she weighed more during that pregnancy than this one. She describes herself as a picky eater and only likes certain foods.   She was diagnosed with a  UTI  on 09/27/20 and was prescribed an antibiotic for GBS UTI, which was asymptomatic. She has not taken this antibiotics according to her.   She denies vaginal bleeding currently. She did have an episode of vaginal bleeding earlier in her pregnancy. She has had two ultrasounds since that time that have been reassuring.  Past Medical History:  Diagnosis Date  . Arthritis   . Endometriosis of uterus     Past Surgical History:  Procedure Laterality Date  . LAPAROSCOPY  2011  . TONSILLECTOMY      Allergies  Allergen Reactions  . Sulfa Antibiotics Hives  . Sumatriptan Succinate Nausea Only and Other (See Comments)    Other Reaction: OTHER REACTION-SOB, VOMI    Social History   Tobacco Use  . Smoking status: Never Smoker  . Smokeless tobacco: Never Used  Vaping Use  . Vaping Use: Never used  Substance Use Topics  . Alcohol use: No  . Drug use: No    Family History  Problem Relation Age of Onset  . Cervical cancer Mother 52  . Hypertension Mother   . Hypertension Maternal Grandmother   . Ovarian cancer Maternal Grandmother 30  . Prostate cancer Maternal [facility]:  She was admitted due to her inability to keep down any food and her weight loss with ongoing struggles to tolerate food by mouth. She was initially admitted and rehydrated. She was given a multivitamin bag containing thiamin 100 mg.  She had essentially normal electrolytes apart from a sodium level of 132.  Her total bilirubin was 1.5 on admission, there is no fractionated result for this.  On her second hospital day, she was evaluated by gastroenterology. There is no concern for celiac sprue.  No further recommendations have come from GI apart from the inclusion of medication for reflux (H2 blocker vs PPI).  She was evaluated by a registered dietician and was found to be severely malnourished, given her BMI of 15.23 kg/m^2.  Recommendations regarding advancing diet and monitoring for refeeding  syndrome were given by the dietician, as well.  She was evaluated by psychiatry, who did not believe the patient had an eating disorder.  An attempt was made to treat the patient's UTI (GBS diagnosed 09/27/2020 at [facility] ER).  The patient did state that from a nausea standpoint she was feeling better and was able to keep down a small amount of food. She has not taken any antiemetics during her hospitalization.  During her hospitalization she has been very hesitant to take any medication at all.  She had an anaphylactic event some time ago. She does not know what caused this event and she states that since that time she has been very anxious about all medications and, to a large extent, foods.  She had normal vital signs during her hospitalization.  Given her severe malnutrition, risk of refeeding syndrome, difficulty in feeding her at all with the possible need for a feeding tube, her nausea and vomiting of pregnancy, and her early gestational age, she is considered very high risk for this hospital. [facility] MFM was consulted for this patient's case. The consulting MFM at [facility] agreed that this was an appropriate reason for transfer and has accepted the patient.    Discharge Exam: BP 96/60 (BP Location: Left Arm)   Pulse 87   Temp 98.4 F (36.9 C) (Oral)   Resp 20   Wt 44.1 kg   LMP 08/01/2020 (Exact Date)   SpO2 98%   BMI 15.23 kg/m  Physical Exam Constitutional:      General: She is not in acute distress.    Appearance: Normal appearance. She is well-developed and underweight.  HENT:     Head: Normocephalic and atraumatic.  Eyes:     General: No scleral icterus.    Conjunctiva/sclera: Conjunctivae normal.  Cardiovascular:     Rate and Rhythm: Normal rate and regular rhythm.     Heart sounds: No murmur heard.  No friction rub. No gallop.   Pulmonary:     Effort: Pulmonary effort is normal. No respiratory distress.     Breath sounds: Normal breath sounds. No wheezing or rales.  Abdominal:      General: Bowel sounds are normal. There is no distension.     Palpations: Abdomen is soft. There is no mass.     Tenderness: There is no abdominal tenderness. There is no guarding or rebound.  Musculoskeletal:        General: No swelling. Normal range of motion.     Cervical back: Normal range of motion and neck supple.  Neurological:     General: No focal deficit present.     Mental Status: She is alert and oriented to person, place, and time.     Cranial Nerves: No cranial nerve deficit.     Motor: No weakness.  Skin:    General: Skin is warm and dry.     Coloration: Skin is not jaundiced.     Findings: No erythema.  Psychiatric:        Mood and Affect: Mood normal.        Behavior: Behavior normal. Behavior  is cooperative.        Judgment: Judgment normal.      Condition at Discharge: Stable  Complications affecting treatment: None  Discharge Medications:  Allergies as of 10/09/2020      Reactions   Sulfa Antibiotics Hives   Sumatriptan Succinate Nausea Only, Other (See Comments)   Other Reaction: OTHER REACTION-SOB, VOMI      Medication List    TAKE these medications   ondansetron 4 MG disintegrating tablet Commonly known as: Zofran ODT Take 1 tablet (4 mg total) by mouth every 6 (six) hours as needed for nausea.   sodium chloride 0.9 % infusion Inject 10 mLs into the vein continuous.        Follow-up arrangements: once discharged from [facility], follow up as indicated by [facility].   Discharge disposition: [facility] Not [facility]ined   I spent greater than 2 hours in researching this patient's history, talking with the patient and examining her, speaking with multiple consultants, and arranging for her transfer.   Signed: Thomasene Mohair, MD  10/09/2020 6:11 PM

## 2020-10-09 NOTE — Progress Notes (Signed)
Initial Nutrition Assessment  DOCUMENTATION CODES:   Severe malnutrition in context of social or environmental circumstances, Underweight  INTERVENTION:  Will discontinue Ensure Enlive.  Provide Dillard [place] Standard 1.4 Vanilla 1 bottle (325 mL) po BID between meals, each supplement provides 455 kcal and 20 grams of protein.  Once banana bag is complete recommend prenatal MVI po daily.  If PO intake does not improve recommend placement of small-bore feeding tube for initiation of tube feeds. If plan is for initiation of tube feeds recommend: -Initiate Jevity 1.2 Cal at 20 mL/hr and advance by 10 mL/hr every 6 hours to goal rate of 60 mL/hr (1440 mL goal daily volume) -Provides 1728 kcal, 80 grams of protein, 1166 mL H2O daily  Reviewed "Morning Sickness Nutrition Therapy" handout from the Academy of Nutrition and Dietetics with patient. Encouraged intake of small, frequent meals throughout the day. Reviewed strategies for managing nausea and improving intake. RD also emailed patient "High-Calorie, High-Protein Nutrition Therapy" handout from Academy of Nutrition and Dietetics.   Monitor magnesium, potassium, and phosphorus daily for at least 3 days, MD to replete as needed, as pt is at risk for refeeding syndrome given severe malnutrition.  NUTRITION DIAGNOSIS:   Severe Malnutrition related to social / environmental circumstances (food aversions/restrictions, inadequate oral intake) as evidenced by severe fat depletion, severe muscle depletion.  GOAL:   Patient will meet greater than or equal to 90% of their needs  MONITOR:   PO intake, Supplement acceptance, Labs, Weight trends, I & O's  REASON FOR ASSESSMENT:   Consult Assessment of nutrition requirement/status, Poor PO  ASSESSMENT:   34 year old female with PMHx of arthritis and endometriosis currently 64w6dgestation admitted with hyperemesis gravidarum, unexplained weight loss.   Met with patient and husband at bedside.  Patient reports approximately two years ago she had a reaction to a food she ate. Since then she has been more restrictive in what she will eat. She reports she typically eats 2 meals per day. She typically skips breakfast. She does not eat beef and does not eat any dairy products. 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 She reports she typically eats chicken and vegetables at her two meals. She has had difficulty with nausea during her pregnancy. She was able to keep food down well until Monday when she had a tooth removed. Since then she has also struggled with emesis and became dehydrated. Patient reports yesterday she only had a small amount of plain baked potato. Today she had some [employer] popcorn chicken she had eaten today (approximately 620 kcal and 39 grams of protein per website) but the breading had been removed, so intake was not 620 kcal. Patient has been unable to try the oral nutrition supplements available on floor as they contain dairy. She is amenable to trying [employer]   Patient reports her UBW approximately two years ago was 120-something lbs. She then lost weight after her food reaction to around 102 lbs. Patient reports she had been working on gaining weight and her pre-pregnancy weight was 105 lbs.  Per review of chart patient was 57.2 kg on 01/25/2019. She is currently 44.1 kg (97.22 lbs). That is a weight loss of 13.1 kg (22.9% body weight) over > 1 year, so unable to determine significance. If this weight loss occurred in one year or less, that would be significant for time frame.  Recommended weight gain for 2nd and 3rd  trimesters is 28-40 lbs as pt is underweight.  Medications reviewed and include: thiamine 100 mg daily IV, ceftriaxone, LR with thiamine 615 mg, folic acid 0.6 mg , adult MVI, potassium chloride 20 mEq.  Labs reviewed: Sodium 132, CO2 17. Potassium, Phosphorus, and Magnesium all WNL today.  Discussed  with RN.  NUTRITION - FOCUSED PHYSICAL EXAM:    Most Recent Value  Orbital Region Severe depletion  Upper Arm Region Severe depletion  Thoracic and Lumbar Region Severe depletion  Buccal Region Moderate depletion  Temple Region Severe depletion  Clavicle Bone Region Severe depletion  Clavicle and Acromion Bone Region Moderate depletion  Scapular Bone Region Moderate depletion  Dorsal Hand Moderate depletion  Patellar Region Severe depletion  Anterior Thigh Region Severe depletion  Posterior Calf Region Severe depletion  Edema (RD Assessment) None  Hair Reviewed  Eyes Reviewed  Mouth Reviewed  Skin Reviewed  Nails Reviewed     Diet Order:   Diet Order            DIET SOFT Room service appropriate? Yes; Fluid consistency: Thin  Diet effective now                EDUCATION NEEDS:   Education needs have been addressed  Skin:  Skin Assessment: Reviewed RN Assessment  Last BM:  10/06/2020 per chart  Height:   Ht Readings from Last 1 Encounters:  10/08/20 '5\' 7"'  (1.702 m)   Weight:   Wt Readings from Last 1 Encounters:  10/09/20 44.1 kg   Ideal Body Weight:  61.4 kg  BMI:  Body mass index is 15.23 kg/m.  Estimated Nutritional Needs:   Kcal:  1700-1900  Protein:  72-82 grams  Fluid:  1.7-1.9 L/day  Jacklynn Barnacle, MS, RD, LDN Pager number available on Amion

## 2020-10-10 ENCOUNTER — Ambulatory Visit (HOSPITAL_COMMUNITY)
Admission: AD | Admit: 2020-10-10 | Discharge: 2020-10-10 | Disposition: A | Discharge status: Home / Self Care | Payer: BC Managed Care – PPO | Source: Other Acute Inpatient Hospital | Attending: Obstetrics and Gynecology | Admitting: Obstetrics and Gynecology

## 2020-10-10 DIAGNOSIS — Z3A00 Weeks of gestation of pregnancy not specified: Secondary | ICD-10-CM | POA: Insufficient documentation

## 2020-10-10 DIAGNOSIS — O2341 Unspecified infection of urinary tract in pregnancy, first trimester: Secondary | ICD-10-CM | POA: Diagnosis not present

## 2020-10-10 DIAGNOSIS — R634 Abnormal weight loss: Secondary | ICD-10-CM | POA: Diagnosis not present

## 2020-10-10 DIAGNOSIS — O2510 Malnutrition in pregnancy, unspecified trimester: Secondary | ICD-10-CM | POA: Insufficient documentation

## 2020-10-10 DIAGNOSIS — B951 Streptococcus, group B, as the cause of diseases classified elsewhere: Secondary | ICD-10-CM | POA: Diagnosis not present

## 2020-10-10 DIAGNOSIS — O210 Mild hyperemesis gravidarum: Secondary | ICD-10-CM | POA: Diagnosis not present

## 2020-10-10 NOTE — Progress Notes (Signed)
On the phone with Matt at [phone] from patient logistics at [facility]. Attempted to reach nurse, but unable to give report at the moment. RN was given MB unit number to call back for report when able to.

## 2020-10-10 NOTE — Progress Notes (Signed)
0300- [facility] arrived to unit, vitals were obtained, paperwork was given to [place], RN.   [phone]- Pt transferred to stretcher and discharged via [facility] to [facility] room [phone].

## 2020-10-10 NOTE — Progress Notes (Signed)
Patient evaluated and examined.  Transport arriving soon.  She has tolerating some solid food without difficulty. Agrees still for transfer to optimize nutrition and monitoring for refeeding syndrome and indicated treatment. She is stable for transport. All questions answered.  Thomasene Mohair, MD, [facility], [facility] 10/10/2020 2:16 AM

## 2020-10-10 NOTE — Progress Notes (Addendum)
0104- Received call from Vonna Kotyk at patient logistics at [facility] to give report to Windell Moulding, RN who will be assuming care of the patient. Pt will be going to [facility] Women's room [phone].  0134Marchelle Folks from [facility] called to confirm they will be transporting pt, ETA unknown at the time  0207- Marchelle Folks from [facility] called to give 20 minutes out call  0208- Dr. Jean Rosenthal at bedside with pt

## 2020-10-14 ENCOUNTER — Telehealth: Payer: Self-pay

## 2020-10-14 NOTE — Telephone Encounter (Signed)
We don't do fluids at the office. They have to be done at the hospital.

## 2020-10-14 NOTE — Telephone Encounter (Signed)
Not sure if it would work, at the birth center we did IV fluids as needed for hyperemesis. If it's only for a few weeks, seems like we could hook her up with an IV and give her a bag of fluids? Unless we don't keep them at the office? Just a thought.

## 2020-10-14 NOTE — Telephone Encounter (Signed)
Patient was seen at [facility] for hyperemesis and treated w/fluids for 2 days. D/C tolerating po. Patient reports this made her feel better than any antiemetics she is/has taken. Sue Lush states she believes the patient would benefit from weekly fluids for the next couple of weeks and is inquiring if this is something we can set up. FB#[phone]

## 2020-10-15 ENCOUNTER — Inpatient Hospital Stay
Admission: AD | Admit: 2020-10-15 | Discharge: 2020-10-16 | DRG: 833 | Disposition: A | Discharge status: Home / Self Care | Payer: BC Managed Care – PPO | Attending: Obstetrics and Gynecology | Admitting: Obstetrics and Gynecology

## 2020-10-15 DIAGNOSIS — O2511 Malnutrition in pregnancy, first trimester: Secondary | ICD-10-CM | POA: Diagnosis present

## 2020-10-15 DIAGNOSIS — Z20822 Contact with and (suspected) exposure to covid-19: Secondary | ICD-10-CM | POA: Diagnosis present

## 2020-10-15 DIAGNOSIS — O210 Mild hyperemesis gravidarum: Principal | ICD-10-CM | POA: Diagnosis present

## 2020-10-15 DIAGNOSIS — E43 Unspecified severe protein-calorie malnutrition: Secondary | ICD-10-CM | POA: Diagnosis present

## 2020-10-15 DIAGNOSIS — Z3490 Encounter for supervision of normal pregnancy, unspecified, unspecified trimester: Secondary | ICD-10-CM

## 2020-10-15 DIAGNOSIS — Z3A10 10 weeks gestation of pregnancy: Secondary | ICD-10-CM | POA: Diagnosis not present

## 2020-10-15 DIAGNOSIS — R634 Abnormal weight loss: Secondary | ICD-10-CM | POA: Diagnosis present

## 2020-10-15 DIAGNOSIS — F4322 Adjustment disorder with anxiety: Secondary | ICD-10-CM | POA: Diagnosis present

## 2020-10-15 LAB — CBC
HCT: 34.7 % — ABNORMAL LOW (ref 36.0–46.0)
Hemoglobin: 12.5 g/dL (ref 12.0–15.0)
MCH: 32.5 pg (ref 26.0–34.0)
MCHC: 36 g/dL (ref 30.0–36.0)
MCV: 90.1 fL (ref 80.0–100.0)
Platelets: 222 10*3/uL (ref 150–400)
RBC: 3.85 MIL/uL — ABNORMAL LOW (ref 3.87–5.11)
RDW: 12.1 % (ref 11.5–15.5)
WBC: 8.8 10*3/uL (ref 4.0–10.5)
nRBC: 0 % (ref 0.0–0.2)

## 2020-10-15 LAB — COMPREHENSIVE METABOLIC PANEL
ALT: 12 U/L (ref 0–44)
AST: 19 U/L (ref 15–41)
Albumin: 4.1 g/dL (ref 3.5–5.0)
Alkaline Phosphatase: 44 U/L (ref 38–126)
Anion gap: 8 (ref 5–15)
BUN: 6 mg/dL (ref 6–20)
CO2: 23 mmol/L (ref 22–32)
Calcium: 9.3 mg/dL (ref 8.9–10.3)
Chloride: 103 mmol/L (ref 98–111)
Creatinine, Ser: 0.5 mg/dL (ref 0.44–1.00)
GFR, Estimated: >60 mL/min (ref >60)
Glucose, Bld: 76 mg/dL (ref 70–99)
Potassium: 3.7 mmol/L (ref 3.5–5.1)
Sodium: 134 mmol/L — ABNORMAL LOW (ref 135–145)
Total Bilirubin: 0.9 mg/dL (ref 0.3–1.2)
Total Protein: 6.4 g/dL — ABNORMAL LOW (ref 6.5–8.1)

## 2020-10-15 LAB — RESP PANEL BY RT-PCR (FLU A&B, COVID) ARPGX2
Influenza A by PCR: NEGATIVE
Influenza B by PCR: NEGATIVE
SARS Coronavirus 2 by RT PCR: NEGATIVE

## 2020-10-15 LAB — MAGNESIUM: Magnesium: 1.9 mg/dL (ref 1.7–2.4)

## 2020-10-15 LAB — PHOSPHORUS: Phosphorus: 3.4 mg/dL (ref 2.5–4.6)

## 2020-10-15 MED ORDER — THIAMINE HCL 100 MG/ML IJ SOLN
INTRAVENOUS | Status: DC
  Filled 2020-10-15 (×2): qty 1000

## 2020-10-15 MED ORDER — ONDANSETRON 4 MG PO TBDP: 4.0000 mg | ORAL_TABLET | Freq: Four times a day (QID) | ORAL | Status: DC | PRN

## 2020-10-15 MED ORDER — POTASSIUM CHLORIDE 2 MEQ/ML IV SOLN
INTRAVENOUS | Status: DC
  Filled 2020-10-15 (×4): qty 1000

## 2020-10-15 MED ORDER — CALCIUM CARBONATE ANTACID 500 MG PO CHEW: 2.0000 | CHEWABLE_TABLET | ORAL | Status: DC | PRN

## 2020-10-15 MED ORDER — KATE FARMS STANDARD 1.4 PO LIQD
325.0000 mL | Freq: Two times a day (BID) | ORAL | Status: DC
  Filled 2020-10-15: qty 325

## 2020-10-15 NOTE — Telephone Encounter (Signed)
I spoke to the patient ans she states that's as soon as she stopped getting the fluids yesterday at the hospital the vomiting started back up. Patient states the last time she was able to keep something on her stomach yesterday morning and the afternoon it came back up. Patient was advised that we can't do fluids in the office. But are inquiring if this can be done at the hospital. Patient is taking Zofran.

## 2020-10-15 NOTE — Telephone Encounter (Signed)
Patient reports she is still getting sick and believes she is dehydrated again. [phone]

## 2020-10-15 NOTE — H&P (Signed)
History and Physical   Penny Day is an 34 y.o. female.  HPI: Patient presents today from home with complaints of dehydration.  She reports that she felt bad  within an hour of the IV fluids just being discontinued at [facility].  She reports that her nausea has slowly worsened since she was discharged home.  She reports that since her last admission she has had 2 episodes of vomiting.  One episode of vomiting once today.  One episode of vomiting was yesterday.  She reports that she is feeling dizzy and weak.  She reports that since her discharge she has mostly had tomato sandwiches to eat.  She reports that her significant other was encouraging her to eat small amounts every 2 hours which she was not always able to do.  She reports that she was also eating small amounts of lima beans, rice, and soup.   Patient was admitted last week to labor and delivery after she was seen in the office and found to have an extremely low BMI and weight.  At that time she was having complaints of dizziness associated with dehydration nausea and vomiting.  Patient reported that she has been having issues with unintentional weight loss for several years.  She reports that a lot of this stems from difficulty tolerating foods and a severe allergic reaction she attributes to dairy where she developed mouth sores.  She has discussed with various providers that she had developed anxiety regarding eating because of this episode.  She did feel like her anxiety around eating was improving prior to her conception.  However now that she is pregnant her nausea related to pregnancy has made eating challenging again.   Patient was admitted here at [facility] on 10/08/2020 and later transferred to [facility] on 10/09/2020.  During her 72 hr + hospital stay she did not have vomiting.  The patient did receive IV fluids and reported that she felt much better with receiving IV fluids.  She was transferred to [facility] because of concerns regarding her low BMI and  possibilities of having issues with refeeding syndrome.  My partner Dr. Jean Rosenthal mostly cared for this patient during her prior hospital stay.  He was able to elicit from the patient that she mostly has been eating small amounts of chicken and green beans.  He also elicited that she generally does not like taking medications.  She has been prescribed Zofran under the tongue in the past and was not always taking it.  In addition to not liking to take medicine she also cannot swallow pills.  She is evaluated by GI on her prior admission who did not attribute her symptoms to any GI related pathology. Psychiatry recommended mindfulness exercises for her food anxiety.   I was able to speak to the MFM attending at [facility] today regarding [name] and her prior admission.  The MFM attending informed me that the patient had requested discharge on Friday when she left [facility].  She told me that she did not feel that the patient was at risk for refeeding syndrome. The patient did eat food prior to her discharge at [facility].  Their MFM program had also recommended that the patient receive weekly IV infusions at home for management of her hyperemesis.  Our office has been contacted regarding setting this up, but given that there is for a was being made in the outpatient basis there has been delay in successfully accomplishing it.    Past Medical History:  Diagnosis Date  Arthritis    Endometriosis of uterus     Past Surgical History:  Procedure Laterality Date   LAPAROSCOPY  2011   TONSILLECTOMY      Family History  Problem Relation Age of Onset   Cervical cancer Mother 17   Hypertension Mother    Hypertension Maternal Grandmother    Ovarian cancer Maternal Grandmother 30   Prostate cancer Maternal Grandfather     Social History:  reports that she has never smoked. She has never used smokeless tobacco. She reports that she does not drink alcohol and does not use drugs.  Allergies:  Allergies   Allergen Reactions   Sulfa Antibiotics Hives   Sumatriptan Succinate Nausea Only and Other (See Comments)    Other Reaction: OTHER REACTION-SOB, VOMI    Medications: I have reviewed the patient's current medications.  No results found for this or any previous visit (from the past 48 hour(s)).  No results found.  Review of Systems  Constitutional: Negative for chills and fever.  HENT: Negative for congestion, hearing loss and sinus pain.   Respiratory: Negative for cough, shortness of breath and wheezing.   Cardiovascular: Negative for chest pain, palpitations and leg swelling.  Gastrointestinal: Negative for abdominal pain, constipation, diarrhea, nausea and vomiting.  Genitourinary: Negative for dysuria, flank pain, frequency, hematuria and urgency.  Musculoskeletal: Negative for back pain.  Skin: Negative for rash.  Neurological: Positive for dizziness and weakness. Negative for headaches.  Psychiatric/Behavioral: Negative for suicidal ideas. The patient is not nervous/anxious.    Blood pressure 106/72, temperature 98 F (36.7 C), temperature source Oral, resp. rate 16, last menstrual period 08/01/2020, SpO2 100 %. Physical Exam Vitals and nursing note reviewed.  Constitutional:      Appearance: She is well-developed. She is ill-appearing.  HENT:     Head: Normocephalic and atraumatic.  Cardiovascular:     Rate and Rhythm: Normal rate and regular rhythm.  Pulmonary:     Effort: Pulmonary effort is normal.     Breath sounds: Normal breath sounds.  Abdominal:     General: Bowel sounds are normal.     Palpations: Abdomen is soft.  Musculoskeletal:        General: Normal range of motion.  Skin:    General: Skin is warm.     Coloration: Skin is jaundiced.  Neurological:     Mental Status: She is alert and oriented to person, place, and time.    Bedside US showed IUP with FHR in the 160s.   Assessment/Plan: 34 yo with food aversions and extremely low BMI concerning  for anorexia in the setting of pregnancy with some compounded pregnancy associated nausea and vomiting coupled with dehydration. Patient does not seem to be openly acknowledging of her eating disorder at this time. Further work and ongoing consultation with specialists will be necessary.   I spoke with the patient and her partner, Ronaldo Miyamoto, today regarding this admission and our goals.  Given her extremely low weight and nutritional deficiency I expressed I am concerned for the patient and for her fetus.  I expressed that she is at risk for miscarriage as well as for growth restriction in her fetus if her nutritional deficiencies continue.  I would like for her to meet with nutrition again. I would like for the patient to consider tube feedings because of her severe weight loss and ongoing nausea.  The plans and recommendations per MFM were to consider weekly IV fluids.  However the patient has been discharged from the  hospital for less than a week and is already having return of her symptoms of nausea, vomiting, low food intake, dehydration and dizziness.  I feel like for this reason the weekly IV fluids at home would likely not adequately address the patient's nutritional deficiencies and would be insufficient.  When I spoke to the patient regarding considering tube feedings she expressed hesitation in terms of if that was something she would be willing to consider.  She did express feel that she does not desire to lose her pregnancy and miscarried and I believe for this reason she will consider the possibility of tube feedings.  We will work towards involvement of maternal-fetal medicine specialist, GI, nutrition, and care management specialist to care for this challenging patient.   More than 90 minutes spent working on patient's admission  Edina Winningham R Chaos Carlile 10/15/2020, 5:20 PM

## 2020-10-15 NOTE — Telephone Encounter (Signed)
Special Scheduling suggested MFM might be able to do this. Can you check with them?

## 2020-10-15 NOTE — Telephone Encounter (Signed)
Called patient She will be direct admitted. Thank you

## 2020-10-15 NOTE — Telephone Encounter (Signed)
I am working on this as far as who this is to be scheduled with.

## 2020-10-15 NOTE — Progress Notes (Signed)
COVID swab per protocol. Sample taken by lab tech and walked to lab.

## 2020-10-15 NOTE — Progress Notes (Signed)
Pt arrived to [facility], waiting for orders to be placed for admission.

## 2020-10-16 ENCOUNTER — Other Ambulatory Visit: Payer: Self-pay | Admitting: Obstetrics and Gynecology

## 2020-10-16 ENCOUNTER — Encounter: Payer: Self-pay | Admitting: Obstetrics and Gynecology

## 2020-10-16 DIAGNOSIS — R627 Adult failure to thrive: Secondary | ICD-10-CM

## 2020-10-16 DIAGNOSIS — Z3481 Encounter for supervision of other normal pregnancy, first trimester: Secondary | ICD-10-CM

## 2020-10-16 DIAGNOSIS — E43 Unspecified severe protein-calorie malnutrition: Secondary | ICD-10-CM

## 2020-10-16 DIAGNOSIS — R1110 Vomiting, unspecified: Secondary | ICD-10-CM

## 2020-10-16 DIAGNOSIS — Z3A10 10 weeks gestation of pregnancy: Secondary | ICD-10-CM

## 2020-10-16 NOTE — Discharge Summary (Signed)
Physician Discharge Summary  Patient ID: Penny Day MRN: [id number] DOB/AGE: 1986-07-04 34 y.o.  Admit date: 10/15/2020 Discharge date: 10/16/2020  Admission Diagnoses:  Discharge Diagnoses:  Principal Problem:   Protein-calorie malnutrition, severe Active Problems:   Supervision of normal pregnancy   Hyperemesis gravidarum   Weight loss   Adjustment disorder with anxiety   Severe protein-calorie malnutrition (HCC)   Discharged Condition: stable  Hospital Course: 34 y.o. G3P1011 at [redacted]w[redacted]d admitted for hyperemesis gravidarum.  The patient has not had any episodes of emesis since admission but does report significant food aversions.  On initial presentation electrolytes stable.   Slight drop in serum albumin which was 7.6g/dL a week ago to 9.9M/EQ on admission.  Her weight loss has been ongoing for the past 2 years (20lbs).  She was seen by Dr. Servando Snare of GI last admission 10/09/2020 prior to transfer to [facility] where she was discharged after receiving IV hydration before being re-admitted for feeling dizzy and lightheaded for IV hydration.  Plan had been for once weekly outpatient IV fluid.  We discussed alternative for providing nutrition including Dobbhoff vs TPN with patient being amenable to trial of Dobbhoff which we do not place at our facility.  The patient requested transfer to [facility].  The case was discussed with Dr. Wilford Corner at [facility] who felt the patient was stable for outpatient follow up to arrange.  She did recommend transfer of care to MFM for remainder of the pregnancy.  This plan vs transfer to [facility] were discussed with the patient and her husband who were amenable.    Consults: None  Significant Diagnostic Studies:  Results for orders placed or performed during the hospital encounter of 10/15/20 (from the past 72 hour(s))  Resp Panel by RT-PCR (Flu A&B, Covid) Nasopharyngeal Swab     Status: None   Collection Time: 10/15/20  5:26 PM   Specimen: Nasopharyngeal  Swab; Nasopharyngeal(NP) swabs in vial transport medium  Result Value Ref Range   SARS Coronavirus 2 by RT PCR NEGATIVE NEGATIVE    Comment: (NOTE) SARS-CoV-2 target nucleic acids are NOT DETECTED.  The SARS-CoV-2 RNA is generally detectable in upper respiratory specimens during the acute phase of infection. The lowest concentration of SARS-CoV-2 viral copies this assay can detect is 138 copies/mL. A negative result does not preclude SARS-Cov-2 infection and should not be used as the sole basis for treatment or other patient management decisions. A negative result may occur with  improper specimen collection/handling, submission of specimen other than nasopharyngeal swab, presence of viral mutation(s) within the areas targeted by this assay, and inadequate number of viral copies(<138 copies/mL). A negative result must be combined with clinical observations, patient history, and epidemiological information. The expected result is Negative.  Fact Sheet for Patients:  BloggerCourse.com  Fact Sheet for Healthcare Providers:  SeriousBroker.it  This test is no t yet approved or cleared by the [place] FDA and  has been authorized for detection and/or diagnosis of SARS-CoV-2 by FDA under an Emergency Use Authorization (EUA). This EUA will remain  in effect (meaning this test can be used) for the duration of the COVID-19 declaration under Section 564(b)(1) of the Act, 21 U.S.C.section 360bbb-3(b)(1), unless the authorization is terminated  or revoked sooner.       Influenza A by PCR NEGATIVE NEGATIVE   Influenza B by PCR NEGATIVE NEGATIVE    Comment: (NOTE) The Xpert Xpress SARS-CoV-2/FLU/RSV plus assay is intended as an aid in the diagnosis of influenza from Nasopharyngeal  swab specimens and should not be used as a sole basis for treatment. Nasal washings and aspirates are unacceptable for Xpert Xpress  SARS-CoV-2/FLU/RSV testing.  Fact Sheet for Patients: BloggerCourse.com  Fact Sheet for Healthcare Providers: SeriousBroker.it  This test is not yet approved or cleared by the [place] FDA and has been authorized for detection and/or diagnosis of SARS-CoV-2 by FDA under an Emergency Use Authorization (EUA). This EUA will remain in effect (meaning this test can be used) for the duration of the COVID-19 declaration under Section 564(b)(1) of the Act, 21 U.S.C. section 360bbb-3(b)(1), unless the authorization is terminated or revoked.  Performed at [facility], [address] [place], [place] [address]   CBC     Status: Abnormal   Collection Time: 10/15/20  5:32 PM  Result Value Ref Range   WBC 8.8 4.0 - 10.5 K/uL   RBC 3.85 (L) 3.87 - 5.11 MIL/uL   Hemoglobin 12.5 12.0 - 15.0 g/dL   HCT 92.1 (L) 36 - 46 %   MCV 90.1 80.0 - 100.0 fL   MCH 32.5 26.0 - 34.0 pg   MCHC 36.0 30.0 - 36.0 g/dL   RDW 19.4 17.4 - 08.1 %   Platelets 222 150 - 400 K/uL   nRBC 0.0 0.0 - 0.2 %    Comment: Performed at [facility], [address] [place], [place] [address]  Comprehensive metabolic panel     Status: Abnormal   Collection Time: 10/15/20  5:32 PM  Result Value Ref Range   Sodium 134 (L) 135 - 145 mmol/L   Potassium 3.7 3.5 - 5.1 mmol/L   Chloride 103 98 - 111 mmol/L   CO2 23 22 - 32 mmol/L   Glucose, Bld 76 70 - 99 mg/dL    Comment: Glucose reference range applies only to samples taken after fasting for at least 8 hours.   BUN 6 6 - 20 mg/dL   Creatinine, Ser 5.63 0.44 - 1.00 mg/dL   Calcium 9.3 8.9 - 14.9 mg/dL   Total Protein 6.4 (L) 6.5 - 8.1 g/dL   Albumin 4.1 3.5 - 5.0 g/dL   AST 19 15 - 41 U/L   ALT 12 0 - 44 U/L   Alkaline Phosphatase 44 38 - 126 U/L   Total Bilirubin 0.9 0.3 - 1.2 mg/dL   GFR, Estimated >70 >26 mL/min    Comment: (NOTE) Calculated using the CKD-EPI Creatinine Equation (2021)     Anion gap 8 5 - 15    Comment: Performed at [facility], [address], [place], [place] [address]  Magnesium     Status: None   Collection Time: 10/15/20  5:32 PM  Result Value Ref Range   Magnesium 1.9 1.7 - 2.4 mg/dL    Comment: Performed at [facility], [address], [place], [place] [address]  Phosphorus     Status: None   Collection Time: 10/15/20  5:32 PM  Result Value Ref Range   Phosphorus 3.4 2.5 - 4.6 mg/dL    Comment: Performed at [facility], [address] [place], [place] [address]   Treatments: IV hydration  Discharge Exam: Blood pressure 102/64, pulse 91, temperature 99.3 F (37.4 C), temperature source Oral, resp. rate 18, height 5\' 7"  (1.702 m), weight 42.3 kg, last menstrual period 08/01/2020, SpO2 100 %. General appearance: alert, appears stated age and no distress Resp: no increased work of breathing Skin: Skin color, texture, turgor normal. No rashes or lesions  Disposition: Discharge disposition: 01-Home or Self Care       Discharge Instructions    Discharge activity:  No Restrictions   Complete by: As directed    Discharge diet:  No restrictions   Complete by: As directed    No sexual activity restrictions   Complete by: As directed    Notify physician for a general feeling that "something is not right"   Complete by: As directed    Notify physician for increase or change in vaginal discharge   Complete by: As directed    Notify physician for intestinal cramps, with or without diarrhea, sometimes described as "gas pain"   Complete by: As directed    Notify physician for leaking of fluid   Complete by: As directed    Notify physician for low, dull backache, unrelieved by heat or Tylenol   Complete by: As directed    Notify physician for menstrual like cramps   Complete by: As directed    Notify physician for pelvic pressure   Complete by: As directed    Notify physician for uterine contractions.  These may  be painless and feel like the uterus is tightening or the baby is  "balling up"   Complete by: As directed    Notify physician for vaginal bleeding   Complete by: As directed    PRETERM LABOR:  Includes any of the follwing symptoms that occur between 20 - [redacted] weeks gestation.  If these symptoms are not stopped, preterm labor can result in preterm delivery, placing your baby at risk   Complete by: As directed      Allergies as of 10/16/2020      Reactions   Sulfa Antibiotics Hives   Sumatriptan Succinate Nausea Only, Other (See Comments)   Other Reaction: OTHER REACTION-SOB, VOMI      Medication List    TAKE these medications   ondansetron 4 MG disintegrating tablet Commonly known as: Zofran ODT Take 1 tablet (4 mg total) by mouth every 6 (six) hours as needed for nausea.   sodium chloride 0.9 % infusion Inject 10 mLs into the vein continuous.        Signed: Vena Austria 10/16/2020, 5:29 PM

## 2020-10-16 NOTE — Progress Notes (Signed)
Patient discharged home with husband. Discharge instructions and current medications reviewed with patient. Patient verbalized understanding. [facility] contact information provided with paperwork- encouraged pt to call first thing tomorrow to set up care with them going forward. Pt walked out with husband, refused wheelchair.

## 2020-10-16 NOTE — Progress Notes (Signed)
Obstetric and Gynecology  Subjective    Objective  Vital signs in last 24 hours: Temp:  [98.4 F (36.9 C)-99.3 F (37.4 C)] 99.3 F (37.4 C) (12/01 1519) Pulse Rate:  [63-91] 91 (12/01 1519) Resp:  [18-20] 18 (12/01 1519) BP: (94-103)/(59-72) 102/64 (12/01 1519) SpO2:  [100 %] 100 % (12/01 0826) Weight:  [42.3 kg] 42.3 kg (11/30 1803) Last BM Date: 10/06/20   Intake/Output Summary (Last 24 hours) at 10/16/2020 1710 Last data filed at 10/16/2020 1430 Gross per 24 hour  Intake 2411.73 ml  Output 3550 ml  Net -1138.27 ml    General: Cardiovascular: Abdomen: Extremities:  Labs: Results for orders placed or performed during the hospital encounter of 10/15/20 (from the past 24 hour(s))  Resp Panel by RT-PCR (Flu A&B, Covid) Nasopharyngeal Swab     Status: None   Collection Time: 10/15/20  5:26 PM   Specimen: Nasopharyngeal Swab; Nasopharyngeal(NP) swabs in vial transport medium  Result Value Ref Range   SARS Coronavirus 2 by RT PCR NEGATIVE NEGATIVE   Influenza A by PCR NEGATIVE NEGATIVE   Influenza B by PCR NEGATIVE NEGATIVE  CBC     Status: Abnormal   Collection Time: 10/15/20  5:32 PM  Result Value Ref Range   WBC 8.8 4.0 - 10.5 K/uL   RBC 3.85 (L) 3.87 - 5.11 MIL/uL   Hemoglobin 12.5 12.0 - 15.0 g/dL   HCT 57.0 (L) 36 - 46 %   MCV 90.1 80.0 - 100.0 fL   MCH 32.5 26.0 - 34.0 pg   MCHC 36.0 30.0 - 36.0 g/dL   RDW 17.7 93.9 - 03.0 %   Platelets 222 150 - 400 K/uL   nRBC 0.0 0.0 - 0.2 %  Comprehensive metabolic panel     Status: Abnormal   Collection Time: 10/15/20  5:32 PM  Result Value Ref Range   Sodium 134 (L) 135 - 145 mmol/L   Potassium 3.7 3.5 - 5.1 mmol/L   Chloride 103 98 - 111 mmol/L   CO2 23 22 - 32 mmol/L   Glucose, Bld 76 70 - 99 mg/dL   BUN 6 6 - 20 mg/dL   Creatinine, Ser 0.92 0.44 - 1.00 mg/dL   Calcium 9.3 8.9 - 33.0 mg/dL   Total Protein 6.4 (L) 6.5 - 8.1 g/dL   Albumin 4.1 3.5 - 5.0 g/dL   AST 19 15 - 41 U/L   ALT 12 0 - 44 U/L    Alkaline Phosphatase 44 38 - 126 U/L   Total Bilirubin 0.9 0.3 - 1.2 mg/dL   GFR, Estimated >07 >62 mL/min   Anion gap 8 5 - 15  Magnesium     Status: None   Collection Time: 10/15/20  5:32 PM  Result Value Ref Range   Magnesium 1.9 1.7 - 2.4 mg/dL  Phosphorus     Status: None   Collection Time: 10/15/20  5:32 PM  Result Value Ref Range   Phosphorus 3.4 2.5 - 4.6 mg/dL    Cultures: Results for orders placed or performed during the hospital encounter of 10/15/20  Resp Panel by RT-PCR (Flu A&B, Covid) Nasopharyngeal Swab     Status: None   Collection Time: 10/15/20  5:26 PM   Specimen: Nasopharyngeal Swab; Nasopharyngeal(NP) swabs in vial transport medium  Result Value Ref Range Status   SARS Coronavirus 2 by RT PCR NEGATIVE NEGATIVE Final    Comment: (NOTE) SARS-CoV-2 target nucleic acids are NOT DETECTED.  The SARS-CoV-2 RNA is generally detectable in  upper respiratory specimens during the acute phase of infection. The lowest concentration of SARS-CoV-2 viral copies this assay can detect is 138 copies/mL. A negative result does not preclude SARS-Cov-2 infection and should not be used as the sole basis for treatment or other patient management decisions. A negative result may occur with  improper specimen collection/handling, submission of specimen other than nasopharyngeal swab, presence of viral mutation(s) within the areas targeted by this assay, and inadequate number of viral copies(<138 copies/mL). A negative result must be combined with clinical observations, patient history, and epidemiological information. The expected result is Negative.  Fact Sheet for Patients:  BloggerCourse.com  Fact Sheet for Healthcare Providers:  SeriousBroker.it  This test is no t yet approved or cleared by the [place] FDA and  has been authorized for detection and/or diagnosis of SARS-CoV-2 by FDA under an Emergency Use Authorization  (EUA). This EUA will remain  in effect (meaning this test can be used) for the duration of the COVID-19 declaration under Section 564(b)(1) of the Act, 21 U.S.C.section 360bbb-3(b)(1), unless the authorization is terminated  or revoked sooner.       Influenza A by PCR NEGATIVE NEGATIVE Final   Influenza B by PCR NEGATIVE NEGATIVE Final    Comment: (NOTE) The Xpert Xpress SARS-CoV-2/FLU/RSV plus assay is intended as an aid in the diagnosis of influenza from Nasopharyngeal swab specimens and should not be used as a sole basis for treatment. Nasal washings and aspirates are unacceptable for Xpert Xpress SARS-CoV-2/FLU/RSV testing.  Fact Sheet for Patients: BloggerCourse.com  Fact Sheet for Healthcare Providers: SeriousBroker.it  This test is not yet approved or cleared by the [place] FDA and has been authorized for detection and/or diagnosis of SARS-CoV-2 by FDA under an Emergency Use Authorization (EUA). This EUA will remain in effect (meaning this test can be used) for the duration of the COVID-19 declaration under Section 564(b)(1) of the Act, 21 U.S.C. section 360bbb-3(b)(1), unless the authorization is terminated or revoked.  Performed at [facility], [address] [place], [place] [address]     Imaging:  Assessment   34 y.o. [phone] at [redacted]w[redacted]d by Estimated Date of Delivery: 05/08/21 admitted for hyperemesis  Plan    1) Hyperemesis - no episodes of emesis during the course of admission.  Significant food aversion though.  We discussed means of providing nutrition including placement of Dobbhoff vs TPN.  We discussed risk and complication as well as benefits of these two modalities.  The patient is actually amenable to proceeding with Dobbhoff placement which IR here at [facility] does not do.  Options for placement include [facility], [facility], or [facility].  The patient is interested in [facility] as she has a recent admission  there.  In addition I think the patient would benefit from the input of dietician, as well as eating disorder clinic.  GI was consulted on the patient previously with no GI etiology elicited.  This also predates the patient's pregnancy with about a 20lbs weight loss in the past 2 years all as a result of food aversion. - Spoke with Dr. Stefano Gaul at [facility] and based on normal electrolytes on admission as well as long standing nature of patient's weight loss they do not feel she meet inpatient criteria at present but do feel it is reasonable for her to transition to MFM care and will work on setting up outpatient IV fluids +/I Dobhoff

## 2020-10-16 NOTE — TOC Progression Note (Signed)
Transition of Care [facility]) - Progression Note    Patient Details  Name: Penny Day MRN: [id number] Date of Birth: 1985-12-17  Transition of Care [facility]) CM/SW Contact  Shawn Route, RN Phone Number: 10/16/2020, 11:24 AM  Clinical Narrative:      Consulted today regarding nutritional costs for outpatient tube feeds.  This service can be provided by [employer].  Zach representative with [employer], did a price check on supplies for feeds:  These costs would be approximately $316 for the first month, and $16 each month following.

## 2020-10-16 NOTE — Discharge Instructions (Signed)
Please call [facility] MFM tomorrow morning to set up appointment ASAP for feeding tube, etc.

## 2020-10-16 NOTE — Telephone Encounter (Signed)
Spoke w/Janet in Scheduling. She will have to inquire w/Courtney Neva Seat, RN when [facility] opens tomorrow. They are only in the office on Mondays and Thursdays.

## 2020-10-16 NOTE — Progress Notes (Addendum)
No c/o nausea this morning. Provided feeding supplement ordered. Denied wanting anything else to eat just ice and coke given. IVF infusing as ordered.   Dietician consult complete @ 1300 TOC consult pending.  Pt aware of current plan for today.  11 am update:  pt drank 1 sip of feeding supplement. Has not ate today, said her husband may go get her some food shortly. Encouraged the need to eat, just sipping on coke.  1430 update:  pt ate a few bites of fried chicken and sips of coke. No c/o nausea. After meeting with the dietician pt stated she is "willing to have the tube placed because it is whats best for her and her baby- just really nervous about the whole process." Pt stated her mother in law has been a Charity fundraiser for 20+ years and would be a great resource and able to help with feedings.  Intake last 24 hr: 1463 mL IVF, 948 mL PO (coke) Output last 24 hour: 2,700 mL

## 2020-10-16 NOTE — Progress Notes (Signed)
Initial Nutrition Assessment  DOCUMENTATION CODES:   Severe malnutrition in context of social or environmental circumstances  INTERVENTION:   Recommend placement of a post pyloric small bore feeding tube and nutrition support  If nasogastric tube is able to be placed, recommend:  [employer] [place] Standard 1.4 formula $RemoveBef'@55ml'OGCytYVraS$ /hr- Initiate at 60ml/hr and increase by 45ml/hr q 8 hours until goal rate is reached (Once pt is tolerating tube feeds and refeed labs are stable can switch to nocturnal feeds)  Free water flushes 154ml q4 hours  Regimen provides 1848kcal/day, 82g/day protein and 153ml/day free water  Pt at high refeed risk; recommend monitor potassium, magnesium and phosphorus labs daily until stable  Provide prenatal multivitamin daily via tube   NUTRITION DIAGNOSIS:   Severe Malnutrition related to social / environmental circumstances (poor oral intake secondary to food aversions/restrictions, nausea) as evidenced by severe fat depletion, severe muscle depletion.  GOAL:   Patient will meet greater than or equal to 90% of their needs  -not met   MONITOR:   PO intake, Labs, Weight trends, TF tolerance, Skin  ASSESSMENT:   34 year old pregnant female with PMHx of arthritis, endometriosis and hyperemesis gravidarum who is admitted with dehydration and weight loss secondary to many food aversions and pregnancy associated nausea and vomiting   Met with patient and husband at bedside. Pt is known to nutrition department from a recent previous admit ~ one week ago. Pt has been struggling with weight gain during this current pregnancy and has in fact lost 12lbs(11%) since becoming pregnant which is significant. Pt associates weight loss with nausea and vomiting from her pregnancy; pt reports that she had these same issues with her first pregnancy but that they weren't as bad. Per chart review, pt weighed ~136lbs in 2018. Pt reports that she weighed 120lbs when she became pregnant with  her first child and that she gained ~15lbs during that pregnancy. Pt had already been seeing her PCP pta for unintentional weight loss prior to becoming pregnant and had reported a 20lb unintentional weight loss at that time. It appears that pt weighed 122lbs in March 2020 when she presented to the ED with facial hives and throat swelling; pt was given antihistamines at that time and was found to have candidiasis. Pt reports that after this encounter, she began to eliminate foods from her diet that she felt were responsible for causing her allergic reaction. By October of 2020, pt had already lost 13lbs and was down to 109lbs. 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 Pt reports that she has gotten a rash before with eating ranch, chocolate, cheese and other milk based products. Pt reports that she avoids all dairy; however, she does report eating fried chicken and other food items that are dredged with milk. Pt also reports lactose intolerance as a child and states that she had to have soy milk. Of note, pt also avoids beef products and eats mainly chicken. Pt reports that on top of having a highly restrictive diet, that she has also been having severe nausea and vomiting and trouble keeping food down r/t her pregnancy. Pt has also had trouble keeping up with her hydration. Pt reports that she was able to eat a few bites of food brought in by her husband today. Pt does not like the [facility] and would like to have this discontinued. Pt reports  that she has not been drinking any supplements at home as they all contain milk but reports that she has been taking gummy prenatal vitamins. RD spoke at length today with patient regarding nasogastric tube placement and nutrition support. Pt reports that her main priority is to keep the baby and herself healthy during this pregnancy. Pt's main concerns today are  fearfulness that tube placement will be uncomfortable, fearfulness that she will continue to vomit up food and fearfulness that she will be able to taste the tube feeds. RD spoke at length with pt concerning the actual tube placement and what she should expect, expectations for for weight gain and tube feed management and details about the tube feed formula that she would be using. 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 Pt reports today that she is willing to have the tube placed because it is whats best for her and her baby. Pt would prefer nocturnal tube feeds. Options for tube placement would include placement of a small bore/post-pyloric nasogastric tube via fluoroscopy or either transfer to [facility] or [place] to have post pyloric Cortrak tube placed. Pt is at high refeed risk and will need to have her electrolytes monitored for several days after initiation of tube feeds.   Spoke with MD regarding RD recommendations  Of note, there has also been some discussion of possible TPN. TPN is not recommended in this case and using the GI system for enteral feeding helps prevent gut mucosal atrophy, reduces septic complications by decreasing bacterial translocation, stimulates gut motility therefore reducing the risk of ileus, and enhances the intestinal immune system. TPN could be considered if pt is unable to tolerate post pyloric enteral feeds.   Medications reviewed and include: LRS $RemoveBefo'@75ml'GYjbNMUoJjJ$ /hr with MVI and KCl  Labs reviewed: Na 134(L), K 3.7 wnl, P 3.4 wnl, Mg 1.9 wnl  NUTRITION - FOCUSED PHYSICAL EXAM:    Most Recent Value  Orbital Region Severe depletion  Upper Arm Region Severe depletion  Thoracic and Lumbar Region Severe depletion  Buccal Region Moderate depletion  Temple Region Severe depletion  Clavicle Bone  Region Severe depletion  Clavicle and Acromion Bone Region Moderate depletion  Scapular Bone Region Moderate depletion  Dorsal Hand Moderate depletion  Patellar Region Severe depletion  Anterior Thigh Region Severe depletion  Posterior Calf Region Severe depletion  Edema (RD Assessment) None  Hair Reviewed  Eyes Reviewed  Mouth Reviewed  Skin Reviewed  Nails Reviewed     Diet Order:   Diet Order            Diet regular Room service appropriate? Yes; Fluid consistency: Thin  Diet effective now                EDUCATION NEEDS:   Education needs have been addressed  Skin:  Skin Assessment: Reviewed RN Assessment  Last BM:  pta  Height:   Ht Readings from Last 1 Encounters:  10/15/20 $RemoveB'5\' 7"'NVpIRmmT$  (1.702 m)   Weight:   Wt Readings from Last 1 Encounters:  10/15/20 42.3 kg   Ideal Body Weight:  61.4 kg  BMI:  Body mass index is 14.6 kg/m.  Estimated Nutritional Needs:   Kcal:  1700-1900kcal/day  Protein:  75-85g/day  Fluid:  >1.5L/day  Koleen Distance MS, RD, LDN Please refer to [facility] for RD and/or RD  on-call/weekend/after hours pager

## 2020-10-18 NOTE — Telephone Encounter (Signed)
After a lot of calling around, it looks like you would do a direct admit every week. Let me know if you need me to do anything to make it happen. Can an order be placed for a once/week direct admit??

## 2020-10-18 NOTE — Telephone Encounter (Signed)
Per Penny Day reports this is not something that they do at/thru MFM. Typically that patient is a direct admit to the floor.

## 2020-10-19 ENCOUNTER — Emergency Department
Admission: EM | Admit: 2020-10-19 | Discharge: 2020-10-19 | Disposition: A | Discharge status: Home / Self Care | Payer: BC Managed Care – PPO | Attending: Emergency Medicine | Admitting: Emergency Medicine

## 2020-10-19 ENCOUNTER — Inpatient Hospital Stay (HOSPITAL_COMMUNITY): Payer: BC Managed Care – PPO

## 2020-10-19 ENCOUNTER — Other Ambulatory Visit: Payer: Self-pay

## 2020-10-19 ENCOUNTER — Encounter (HOSPITAL_COMMUNITY): Payer: Self-pay | Admitting: Obstetrics and Gynecology

## 2020-10-19 ENCOUNTER — Inpatient Hospital Stay (HOSPITAL_COMMUNITY)
Admission: AD | Admit: 2020-10-19 | Discharge: 2020-10-21 | DRG: 831 | Disposition: A | Discharge status: Home / Self Care | Payer: BC Managed Care – PPO | Attending: Obstetrics and Gynecology | Admitting: Obstetrics and Gynecology

## 2020-10-19 ENCOUNTER — Encounter: Payer: Self-pay | Admitting: Emergency Medicine

## 2020-10-19 DIAGNOSIS — Z20822 Contact with and (suspected) exposure to covid-19: Secondary | ICD-10-CM | POA: Diagnosis present

## 2020-10-19 DIAGNOSIS — Z3A11 11 weeks gestation of pregnancy: Secondary | ICD-10-CM

## 2020-10-19 DIAGNOSIS — E860 Dehydration: Secondary | ICD-10-CM | POA: Insufficient documentation

## 2020-10-19 DIAGNOSIS — Z3481 Encounter for supervision of other normal pregnancy, first trimester: Secondary | ICD-10-CM

## 2020-10-19 DIAGNOSIS — O99891 Other specified diseases and conditions complicating pregnancy: Secondary | ICD-10-CM | POA: Diagnosis not present

## 2020-10-19 DIAGNOSIS — Z5321 Procedure and treatment not carried out due to patient leaving prior to being seen by health care provider: Secondary | ICD-10-CM | POA: Insufficient documentation

## 2020-10-19 DIAGNOSIS — O211 Hyperemesis gravidarum with metabolic disturbance: Secondary | ICD-10-CM | POA: Diagnosis not present

## 2020-10-19 DIAGNOSIS — E43 Unspecified severe protein-calorie malnutrition: Secondary | ICD-10-CM | POA: Diagnosis present

## 2020-10-19 DIAGNOSIS — O2511 Malnutrition in pregnancy, first trimester: Secondary | ICD-10-CM | POA: Diagnosis present

## 2020-10-19 DIAGNOSIS — O219 Vomiting of pregnancy, unspecified: Secondary | ICD-10-CM | POA: Insufficient documentation

## 2020-10-19 DIAGNOSIS — R8271 Bacteriuria: Secondary | ICD-10-CM | POA: Clinically undetermined

## 2020-10-19 DIAGNOSIS — O99281 Endocrine, nutritional and metabolic diseases complicating pregnancy, first trimester: Secondary | ICD-10-CM | POA: Insufficient documentation

## 2020-10-19 DIAGNOSIS — R634 Abnormal weight loss: Secondary | ICD-10-CM | POA: Diagnosis present

## 2020-10-19 DIAGNOSIS — O210 Mild hyperemesis gravidarum: Principal | ICD-10-CM | POA: Diagnosis present

## 2020-10-19 DIAGNOSIS — O99820 Streptococcus B carrier state complicating pregnancy: Secondary | ICD-10-CM | POA: Diagnosis not present

## 2020-10-19 LAB — RESP PANEL BY RT-PCR (RSV, FLU A&B, COVID)  RVPGX2
Influenza A by PCR: NEGATIVE
Influenza B by PCR: NEGATIVE
Resp Syncytial Virus by PCR: NEGATIVE
SARS Coronavirus 2 by RT PCR: NEGATIVE

## 2020-10-19 LAB — URINALYSIS, ROUTINE W REFLEX MICROSCOPIC
Bilirubin Urine: NEGATIVE
Glucose, UA: NEGATIVE mg/dL
Ketones, ur: 80 mg/dL — AB
Nitrite: NEGATIVE
Protein, ur: 30 mg/dL — AB
Specific Gravity, Urine: 1.029 (ref 1.005–1.030)
pH: 5 (ref 5.0–8.0)

## 2020-10-19 LAB — COMPREHENSIVE METABOLIC PANEL
ALT: 13 U/L (ref 0–44)
AST: 19 U/L (ref 15–41)
Albumin: 4.5 g/dL (ref 3.5–5.0)
Alkaline Phosphatase: 43 U/L (ref 38–126)
Anion gap: 11 (ref 5–15)
BUN: 8 mg/dL (ref 6–20)
CO2: 21 mmol/L — ABNORMAL LOW (ref 22–32)
Calcium: 9.6 mg/dL (ref 8.9–10.3)
Chloride: 101 mmol/L (ref 98–111)
Creatinine, Ser: 0.56 mg/dL (ref 0.44–1.00)
GFR, Estimated: >60 mL/min (ref >60)
Glucose, Bld: 71 mg/dL (ref 70–99)
Potassium: 3.9 mmol/L (ref 3.5–5.1)
Sodium: 133 mmol/L — ABNORMAL LOW (ref 135–145)
Total Bilirubin: 1.1 mg/dL (ref 0.3–1.2)
Total Protein: 7.2 g/dL (ref 6.5–8.1)

## 2020-10-19 LAB — CBC
HCT: 38.6 % (ref 36.0–46.0)
Hemoglobin: 13.5 g/dL (ref 12.0–15.0)
MCH: 32.1 pg (ref 26.0–34.0)
MCHC: 35 g/dL (ref 30.0–36.0)
MCV: 91.9 fL (ref 80.0–100.0)
Platelets: 245 10*3/uL (ref 150–400)
RBC: 4.2 MIL/uL (ref 3.87–5.11)
RDW: 11.9 % (ref 11.5–15.5)
WBC: 8 10*3/uL (ref 4.0–10.5)
nRBC: 0 % (ref 0.0–0.2)

## 2020-10-19 LAB — LIPASE, BLOOD: Lipase: 20 U/L (ref 11–51)

## 2020-10-19 LAB — RAPID URINE DRUG SCREEN, HOSP PERFORMED
Amphetamines: NOT DETECTED
Barbiturates: NOT DETECTED
Benzodiazepines: NOT DETECTED
Cocaine: NOT DETECTED
Opiates: NOT DETECTED
Tetrahydrocannabinol: NOT DETECTED

## 2020-10-19 MED ORDER — FAMOTIDINE IN NACL 20-0.9 MG/50ML-% IV SOLN
20.0000 mg | Freq: Every day | INTRAVENOUS | Status: DC
  Administered 2020-10-20 – 2020-10-21 (×2): 20 mg via INTRAVENOUS
  Filled 2020-10-19 (×2): qty 50

## 2020-10-19 MED ORDER — LACTATED RINGERS IV SOLN: INTRAVENOUS | Status: DC

## 2020-10-19 MED ORDER — FAMOTIDINE IN NACL 20-0.9 MG/50ML-% IV SOLN
20.0000 mg | Freq: Once | INTRAVENOUS | Status: AC
  Administered 2020-10-19: 20 mg via INTRAVENOUS
  Filled 2020-10-19: qty 50

## 2020-10-19 MED ORDER — ENOXAPARIN SODIUM 30 MG/0.3ML ~~LOC~~ SOLN
30.0000 mg | SUBCUTANEOUS | Status: DC
  Administered 2020-10-20: 30 mg via SUBCUTANEOUS
  Filled 2020-10-19: qty 0.3

## 2020-10-19 MED ORDER — PROMETHAZINE HCL 25 MG/ML IJ SOLN: 25.0000 mg | Freq: Once | INTRAMUSCULAR | Status: DC

## 2020-10-19 MED ORDER — PROCHLORPERAZINE EDISYLATE 10 MG/2ML IJ SOLN
10.0000 mg | Freq: Four times a day (QID) | INTRAMUSCULAR | Status: DC
  Administered 2020-10-20: 10 mg via INTRAVENOUS
  Filled 2020-10-19 (×4): qty 2

## 2020-10-19 MED ORDER — ENOXAPARIN SODIUM 40 MG/0.4ML ~~LOC~~ SOLN
40.0000 mg | SUBCUTANEOUS | Status: DC
  Administered 2020-10-19: 40 mg via SUBCUTANEOUS
  Filled 2020-10-19: qty 0.4

## 2020-10-19 MED ORDER — ACETAMINOPHEN 325 MG PO TABS: 650.0000 mg | ORAL_TABLET | ORAL | Status: DC | PRN

## 2020-10-19 MED ORDER — SCOPOLAMINE 1 MG/3DAYS TD PT72
1.0000 | MEDICATED_PATCH | TRANSDERMAL | Status: DC
  Administered 2020-10-19: 1.5 mg via TRANSDERMAL
  Filled 2020-10-19: qty 1

## 2020-10-19 MED ORDER — THIAMINE HCL 100 MG/ML IJ SOLN
Freq: Once | INTRAVENOUS | Status: AC
  Filled 2020-10-19: qty 1000

## 2020-10-19 MED ORDER — PROMETHAZINE HCL 25 MG/ML IJ SOLN
12.5000 mg | Freq: Once | INTRAMUSCULAR | Status: AC
  Administered 2020-10-19: 12.5 mg via INTRAVENOUS
  Filled 2020-10-19: qty 1

## 2020-10-19 MED ORDER — PROCHLORPERAZINE EDISYLATE 10 MG/2ML IJ SOLN: 10.0000 mg | Freq: Four times a day (QID) | INTRAMUSCULAR | Status: DC

## 2020-10-19 MED ORDER — LACTATED RINGERS IV BOLUS: 1000.0000 mL | Freq: Once | INTRAVENOUS | Status: DC

## 2020-10-19 NOTE — MAU Note (Signed)
Penny Day is a 34 y.o. at [redacted]w[redacted]d here in MAU reporting: states she has been sick with this pregnancy. States she has been admitted twice and nausea/vomiting is well controlled when she is admitted but when she gets home it gets bad again. States she threw up a bunch yesterday but none today.  Onset of complaint: ongoing  Pain score: 0/10  Vitals:   10/19/20 1542  BP: 110/61  Pulse: 79  Resp: 18  Temp: 97.8 F (36.6 C)  SpO2: 100%     Lab orders placed from triage: UA

## 2020-10-19 NOTE — H&P (Signed)
History     CSN: [id number]  Arrival date and time: 10/19/20 1510   First Provider Initiated Contact with Patient 10/19/20 1550      Chief Complaint  Patient presents with   Nausea   Emesis   HPI Penny Day is a 34 y.o. G3P1011 at [redacted]w[redacted]d who presents from [facility] for nausea and vomiting. She reports this is an ongoing issue. She states last night she vomited continuously but has not vomited today. She reports a 10 pound weight loss from her prepregnancy weight. She has zofran at home but states it is not helping. She denies any vaginal bleeding or discharge.   OB History    Gravida  3   Para  1   Term  1   Preterm      AB  1   Living  1     SAB  1   TAB      Ectopic      Multiple      Live Births  1           Past Medical History:  Diagnosis Date   Arthritis    Endometriosis of uterus     Past Surgical History:  Procedure Laterality Date   LAPAROSCOPY  2011   TONSILLECTOMY      Family History  Problem Relation Age of Onset   Cervical cancer Mother 33   Hypertension Mother    Hypertension Maternal Grandmother    Ovarian cancer Maternal Grandmother 30   Prostate cancer Maternal Grandfather     Social History   Tobacco Use   Smoking status: Never Smoker   Smokeless tobacco: Never Used  Vaping Use   Vaping Use: Never used  Substance Use Topics   Alcohol use: No   Drug use: No    Allergies:  Allergies  Allergen Reactions   Sulfa Antibiotics Hives   Sumatriptan Succinate Nausea Only and Other (See Comments)    Other Reaction: OTHER REACTION-SOB, VOMI    Medications Prior to Admission  Medication Sig Dispense Refill Last Dose   ondansetron (ZOFRAN ODT) 4 MG disintegrating tablet Take 1 tablet (4 mg total) by mouth every 6 (six) hours as needed for nausea. 20 tablet 2    sodium chloride 0.9 % infusion Inject 10 mLs into the vein continuous.       Review of Systems  Constitutional: Negative.   Negative for fatigue and fever.  HENT: Negative.   Respiratory: Negative.  Negative for shortness of breath.   Cardiovascular: Negative.  Negative for chest pain.  Gastrointestinal: Positive for nausea and vomiting. Negative for abdominal pain, constipation and diarrhea.  Genitourinary: Negative.  Negative for dysuria.  Neurological: Negative.  Negative for dizziness and headaches.   Physical Exam   Blood pressure 110/61, pulse 79, temperature 97.8 F (36.6 C), temperature source Oral, resp. rate 18, height 5\' 7"  (1.702 m), weight 43.1 kg, last menstrual period 08/01/2020, SpO2 100 %.  Physical Exam Vitals and nursing note reviewed.  Constitutional:      General: She is not in acute distress.    Appearance: She is well-developed and underweight. She is ill-appearing.  HENT:     Head: Normocephalic.  Eyes:     Pupils: Pupils are equal, round, and reactive to light.  Cardiovascular:     Rate and Rhythm: Normal rate and regular rhythm.     Heart sounds: Normal heart sounds.  Pulmonary:     Effort: Pulmonary effort is normal. No  respiratory distress.     Breath sounds: Normal breath sounds.  Abdominal:     General: Bowel sounds are normal. There is no distension.     Palpations: Abdomen is soft.     Tenderness: There is no abdominal tenderness.  Skin:    General: Skin is warm and dry.  Neurological:     Mental Status: She is alert and oriented to person, place, and time.  Psychiatric:        Behavior: Behavior normal.        Thought Content: Thought content normal.        Judgment: Judgment normal.    FHT: 172 bpm  MAU Course  Procedures Results for orders placed or performed during the hospital encounter of 10/19/20 (from the past 24 hour(s))  Urinalysis, Routine w reflex microscopic Urine, Clean Catch     Status: Abnormal   Collection Time: 10/19/20  4:02 PM  Result Value Ref Range   Color, Urine YELLOW YELLOW   APPearance HAZY (A) CLEAR   Specific Gravity, Urine  1.029 1.005 - 1.030   pH 5.0 5.0 - 8.0   Glucose, UA NEGATIVE NEGATIVE mg/dL   Hgb urine dipstick MODERATE (A) NEGATIVE   Bilirubin Urine NEGATIVE NEGATIVE   Ketones, ur 80 (A) NEGATIVE mg/dL   Protein, ur 30 (A) NEGATIVE mg/dL   Nitrite NEGATIVE NEGATIVE   Leukocytes,Ua TRACE (A) NEGATIVE   RBC / HPF 0-5 0 - 5 RBC/hpf   WBC, UA 21-50 0 - 5 WBC/hpf   Bacteria, UA RARE (A) NONE SEEN   Squamous Epithelial / LPF 11-20 0 - 5   Mucus PRESENT    Hyaline Casts, UA PRESENT   Urine rapid drug screen (hosp performed)     Status: None   Collection Time: 10/19/20  4:02 PM  Result Value Ref Range   Opiates NONE DETECTED NONE DETECTED   Cocaine NONE DETECTED NONE DETECTED   Benzodiazepines NONE DETECTED NONE DETECTED   Amphetamines NONE DETECTED NONE DETECTED   Tetrahydrocannabinol NONE DETECTED NONE DETECTED   Barbiturates NONE DETECTED NONE DETECTED   MDM UA CBC, CMP, Lipase  Consulted with Dr. Vergie Living- will come assess patient.   Assessment and Plan  Nausea and vomiting of pregnancy [redacted] weeks gestation  -Admit to [facility] -Care turned over to Dr. Glennon Mac Leone Payor CNM 10/19/2020, 5:39 PM

## 2020-10-19 NOTE — ED Notes (Signed)
[facility] May 08, 2021

## 2020-10-19 NOTE — ED Triage Notes (Signed)
Pt to ED via POV with c/o vomiting. Pt states is a G3P1L1A1 at approx 11 weeks. Pt states recently hospitalized with dehydration and thinks she is dehydrated again. VSS.

## 2020-10-19 NOTE — ED Notes (Signed)
Called for V/S reassessment. No answer at this time.

## 2020-10-19 NOTE — ED Notes (Signed)
Called for VS - no answer 

## 2020-10-20 DIAGNOSIS — E860 Dehydration: Secondary | ICD-10-CM

## 2020-10-20 DIAGNOSIS — R634 Abnormal weight loss: Secondary | ICD-10-CM

## 2020-10-20 DIAGNOSIS — O211 Hyperemesis gravidarum with metabolic disturbance: Secondary | ICD-10-CM

## 2020-10-20 DIAGNOSIS — O99891 Other specified diseases and conditions complicating pregnancy: Secondary | ICD-10-CM

## 2020-10-20 LAB — T4, FREE: Free T4: 1.08 ng/dL (ref 0.61–1.12)

## 2020-10-20 LAB — URINE CULTURE: Culture: NO GROWTH

## 2020-10-20 LAB — TSH: TSH: 0.274 u[IU]/mL — ABNORMAL LOW (ref 0.350–4.500)

## 2020-10-20 LAB — MAGNESIUM: Magnesium: 1.6 mg/dL — ABNORMAL LOW (ref 1.7–2.4)

## 2020-10-20 LAB — PREALBUMIN: Prealbumin: 10.5 mg/dL — ABNORMAL LOW (ref 18–38)

## 2020-10-20 MED ORDER — PROCHLORPERAZINE EDISYLATE 10 MG/2ML IJ SOLN
10.0000 mg | Freq: Four times a day (QID) | INTRAMUSCULAR | Status: DC | PRN
  Filled 2020-10-20: qty 2

## 2020-10-20 MED ORDER — ONDANSETRON 4 MG PO TBDP
8.0000 mg | ORAL_TABLET | Freq: Three times a day (TID) | ORAL | Status: DC
  Administered 2020-10-20 – 2020-10-21 (×4): 8 mg via ORAL
  Filled 2020-10-20 (×4): qty 2

## 2020-10-20 MED ORDER — MAGNESIUM SULFATE 2 GM/50ML IV SOLN
2.0000 g | Freq: Once | INTRAVENOUS | Status: AC
  Administered 2020-10-20: 2 g via INTRAVENOUS
  Filled 2020-10-20: qty 50

## 2020-10-20 MED ORDER — PROMETHAZINE HCL 25 MG/ML IJ SOLN: 12.5000 mg | Freq: Four times a day (QID) | INTRAMUSCULAR | Status: DC | PRN

## 2020-10-20 NOTE — Progress Notes (Signed)
Daily Antepartum Note  Admission Date: 10/19/2020 Current Date: 10/20/2020  Penny Day is a 34 y.o. G3P1011 @ [redacted]w[redacted]d by (6wk u/s), HD#2, admitted for weight loss in pregnancy, persistent n/v, dehydration.  Pregnancy complicated by: Patient Active Problem List   Diagnosis Date Noted  . Nausea and vomiting during pregnancy 10/19/2020  . GBS bacteriuria 10/19/2020  . Severe protein-calorie malnutrition (HCC) 10/16/2020  . Hyperemesis gravidarum 10/09/2020  . Adjustment disorder with anxiety 10/09/2020  . Protein-calorie malnutrition, severe 10/09/2020  . Weight loss   . Intractable vomiting   . Supervision of normal pregnancy 09/25/2020  . Endometriosis determined by laparoscopy 05/02/2009    Overnight/24hr events:  None  Subjective:  Patient able to get some sleep. Pt feels okay, wondering if it could be the scope patch helping  Objective:    Current Vital Signs 24h Vital Sign Ranges  T 98.4 F (36.9 C) Temp  Avg: 98.3 F (36.8 C)  Min: 97.8 F (36.6 C)  Max: 98.5 F (36.9 C)  BP (!) 95/49 BP  Min: 90/43  Max: 111/62  HR 68 Pulse  Avg: 70.8  Min: 61  Max: 82  RR 17 Resp  Avg: 17.2  Min: 15  Max: 20  SaO2 99 % Room Air SpO2  Avg: 99.3 %  Min: 98 %  Max: 100 %       24 Hour I/O Current Shift I/O  Time Ins Outs 12/04 0701 - 12/05 0700 In: 1010.4 [I.V.:1010.4] Out: -  12/05 0701 - 12/05 1900 In: 250 [I.V.:250] Out: -     Physical exam: General: Well nourished, well developed female in no acute distress. Abdomen: nttp Cardiovascular: S1, S2 normal, no murmur, rub or gallop, regular rate and rhythm Respiratory: CTAB Extremities: no clubbing, cyanosis or edema Skin: Warm and dry.   Medications: Current Facility-Administered Medications  Medication Dose Route Frequency Provider Last Rate Last Admin  . acetaminophen (TYLENOL) tablet 650 mg  650 mg Oral Q4H PRN [place] Bing, MD      . enoxaparin (LOVENOX) injection 30 mg  30 mg Subcutaneous Q24H [place] Bing, MD      . famotidine (PEPCID) IVPB 20 mg premix  20 mg Intravenous Daily Olga Bing, MD      . lactated ringers infusion   Intravenous Continuous [place] Bing, MD 125 mL/hr at 10/20/20 0452 Rate Change at 10/20/20 0452  . ondansetron (ZOFRAN-ODT) disintegrating tablet 8 mg  8 mg Oral Q8H [place] Bing, MD   8 mg at 10/20/20 0640  . prochlorperazine (COMPAZINE) injection 10 mg  10 mg Intravenous Q6H PRN [place] Bing, MD      . scopolamine (TRANSDERM-SCOP) 1 MG/3DAYS 1.5 mg  1 patch Transdermal Q72H Cleone Slim M, CNM   1.5 mg at 10/19/20 1708    Labs:  Pending: Ucx  Mg: 1.6  Prealbumin 10.5  TSH: 0.274 but fT4 normal   Recent Labs  Lab 10/15/20 1732 10/19/20 1227  WBC 8.8 8.0  HGB 12.5 13.5  HCT 34.7* 38.6  PLT 222 245    Recent Labs  Lab 10/15/20 1732 10/19/20 1227  NA 134* 133*  K 3.7 3.9  CL 103 101  CO2 23 21*  BUN 6 8  CREATININE 0.50 0.56  CALCIUM 9.3 9.6  PROT 6.4* 7.2  BILITOT 0.9 1.1  ALKPHOS 44 43  ALT 12 13  AST 19 19  GLUCOSE 76 71    ECG: normal   Radiology:  RUQ u/s neg except for incidental GB polyp  Assessment & Plan:  Pt stable *Pregnancy: repeat FHTs prior to d/c. Low TSH likely due to early pregnancy. Repeat TFTs at 28wks *PPx: lovenox *FEN/GI: RD consulted. Consider zofran pump at home and/or consideration for tube feeds. Consider trying to set up once a week IVFs as an outpatient. Consult GI PRN *Dispo: once outpatient plan in place.   Cornelia Copa MD Attending Center for [facility] GYN Consult Phone: [phone] (M-F, 0800-1700) & [phone] (Off hours, weekends, holidays)

## 2020-10-20 NOTE — Progress Notes (Addendum)
Initial Nutrition Assessment  DOCUMENTATION CODES:   Underweight, Severe malnutrition in context of acute illness/injury  INTERVENTION:  Given current BMI and ongoing weight loss, inabilty to tol PO per chart Consider initiation of tube feedings: Osmolite 1.2 at a goal rate of 55 ml/hr CNG ( 1584 Kcal, 73 g protein/kg, 1082 ml free water) Initiate at 10 ml/hr and advance cautiously by 10 ml q 8 hours to goal vol CMP daily ( monitor K, Mg and Phos, high risk for refeeding syn )  NUTRITION DIAGNOSIS:   Inadequate oral intake related to nausea, vomiting as evidenced by  .weight loss  GOAL:   Patient will meet greater than or equal to 90% of their needs  MONITOR:   Weight trends, Labs  REASON FOR ASSESSMENT:   Consult, Diagnosis Hyperemesis  ASSESSMENT:   11 3/7 weeks IUP adm w/ hyperemesis. ongoing n/v entire pregnancy, manged outpt at [facility]. Hx of excessive weight loss over past  2-3 years. Weight 08/17/2017 61.7 Kg, BMI 21. 34 lb weight loss as compared to initial prenatal visit. 6 lb weight loss since initial prenatal visit.  Current BMI 15.98  Labs noted, pre ablumin 10.5   Currently maintained on LR at 125 ml/hr, IVF with MVI completed No Po intake overnight reported  Diet Order:   Diet Order    None      EDUCATION NEEDS:   No education needs have been identified at this time  Skin:  Skin Assessment: Reviewed RN Assessment   Height:   Ht Readings from Last 1 Encounters:  10/19/20 5\' 7"  (1.702 m)    Weight:   Wt Readings from Last 1 Encounters:  10/20/20 43.5 kg    Ideal Body Weight:   135 lbs  BMI:  Body mass index is 15.02 kg/m.  Estimated Nutritional Needs:   Kcal:  1400 - 1600  Protein:  60-70 g  Fluid:  1.7 L    14/05/21 M.M LDN Neonatal Nutrition Support Specialist/RD III

## 2020-10-20 NOTE — Progress Notes (Signed)
Patient able to eat "Popcorn chicken" from [facility] without n/v.  Drinking Coke and eating ice chips.  States she does feel better.  Urine output has increased and more frequent.  Urine straw colored with minimal sediment.

## 2020-10-20 NOTE — Plan of Care (Signed)
Patient has not vomited during the night nor has she tried to drink.IV fluids continue and MVI has infused.

## 2020-10-21 DIAGNOSIS — O99820 Streptococcus B carrier state complicating pregnancy: Secondary | ICD-10-CM

## 2020-10-21 DIAGNOSIS — O219 Vomiting of pregnancy, unspecified: Secondary | ICD-10-CM

## 2020-10-21 MED ORDER — PANTOPRAZOLE SODIUM 40 MG PO TBEC: 40.0000 mg | DELAYED_RELEASE_TABLET | Freq: Every day | ORAL | 3 refills | Status: AC

## 2020-10-21 NOTE — Discharge Instructions (Signed)
Hyperemesis Gravidarum Hyperemesis gravidarum is a severe form of nausea and vomiting that happens during pregnancy. Hyperemesis is worse than morning sickness. It may cause you to have nausea or vomiting all day for many days. It may keep you from eating and drinking enough food and liquids, which can lead to dehydration, malnutrition, and weight loss. Hyperemesis usually occurs during the first half (the first 20 weeks) of pregnancy. It often goes away once a woman is in her second half of pregnancy. However, sometimes hyperemesis continues through an entire pregnancy. What are the causes? The cause of this condition is not known. It may be related to changes in chemicals (hormones) in the body during pregnancy, such as the high level of pregnancy hormone (human chorionic gonadotropin) or the increase in the female sex hormone (estrogen). What are the signs or symptoms? Symptoms of this condition include:  Nausea that does not go away.  Vomiting that does not allow you to keep any food down.  Weight loss.  Body fluid loss (dehydration).  Having no desire to eat, or not liking food that you have previously enjoyed. How is this diagnosed? This condition may be diagnosed based on:  A physical exam.  Your medical history.  Your symptoms.  Blood tests.  Urine tests. How is this treated? This condition is managed by controlling symptoms. This may include:  Following an eating plan. This can help lessen nausea and vomiting.  Taking prescription medicines. An eating plan and medicines are often used together to help control symptoms. If medicines do not help relieve nausea and vomiting, you may need to receive fluids through an IV at the hospital. Follow these instructions at home: Eating and drinking   Avoid the following: ? Drinking fluids with meals. Try not to drink anything during the 30 minutes before and after your meals. ? Drinking more than 1 cup of fluid at a  time. ? Eating foods that trigger your symptoms. These may include spicy foods, coffee, high-fat foods, very sweet foods, and acidic foods. ? Skipping meals. Nausea can be more intense on an empty stomach. If you cannot tolerate food, do not force it. Try sucking on ice chips or other frozen items and make up for missed calories later. ? Lying down within 2 hours after eating. ? Being exposed to environmental triggers. These may include food smells, smoky rooms, closed spaces, rooms with strong smells, warm or humid places, overly loud and noisy rooms, and rooms with motion or flickering lights. Try eating meals in a well-ventilated area that is free of strong smells. ? Quick and sudden changes in your movement. ? Taking iron pills and multivitamins that contain iron. If you take prescription iron pills, do not stop taking them unless your health care provider approves. ? Preparing food. The smell of food can spoil your appetite or trigger nausea.  To help relieve your symptoms: ? Listen to your body. Everyone is different and has different preferences. Find what works best for you. ? Eat and drink slowly. ? Eat 5-6 small meals daily instead of 3 large meals. Eating small meals and snacks can help you avoid an empty stomach. ? In the morning, before getting out of bed, eat a couple of crackers to avoid moving around on an empty stomach. ? Try eating starchy foods as these are usually tolerated well. Examples include cereal, toast, bread, potatoes, pasta, rice, and pretzels. ? Include at least 1 serving of protein with your meals and snacks. Protein options include   lean meats, poultry, seafood, beans, nuts, nut butters, eggs, cheese, and yogurt. ? Try eating a protein-rich snack before bed. Examples of a protein-rick snack include cheese and crackers or a peanut butter sandwich made with 1 slice of whole-wheat bread and 1 tsp (5 g) of peanut butter. ? Eat or suck on things that have ginger in them.  It may help relieve nausea. Add  tsp ground ginger to hot tea or choose ginger tea. ? Try drinking 100% fruit juice or an electrolyte drink. An electrolyte drink contains sodium, potassium, and chloride. ? Drink fluids that are cold, clear, and carbonated or sour. Examples include lemonade, ginger ale, lemon-lime soda, ice water, and sparkling water. ? Brush your teeth or use a mouth rinse after meals. ? Talk with your health care provider about starting a supplement of vitamin B6. General instructions  Take over-the-counter and prescription medicines only as told by your health care provider.  Follow instructions from your health care provider about eating or drinking restrictions.  Continue to take your prenatal vitamins as told by your health care provider. If you are having trouble taking your prenatal vitamins, talk with your health care provider about different options.  Keep all follow-up and pre-birth (prenatal) visits as told by your health care provider. This is important. Contact a health care provider if:  You have pain in your abdomen.  You have a severe headache.  You have vision problems.  You are losing weight.  You feel weak or dizzy. Get help right away if:  You cannot drink fluids without vomiting.  You vomit blood.  You have Penny Day nausea and vomiting.  You are very weak.  You faint.  You have a fever and your symptoms suddenly get worse. Summary  Hyperemesis gravidarum is a severe form of nausea and vomiting that happens during pregnancy.  Making some changes to your eating habits may help relieve nausea and vomiting.  This condition may be managed with medicine.  If medicines do not help relieve nausea and vomiting, you may need to receive fluids through an IV at the hospital. This information is not intended to replace advice given to you by your health care provider. Make sure you discuss any questions you have with your health care  provider. Document Revised: 11/22/2017 Document Reviewed: 07/01/2016 Elsevier Patient Education  2020 [employer]  

## 2020-10-21 NOTE — Discharge Summary (Signed)
Physician Discharge Summary  Patient ID: Penny Day MRN: [id number] DOB/AGE: 32-Sep-1987 32 y.o.  Admit date: 10/19/2020 Discharge date: 10/21/2020  Admission Diagnoses:  Discharge Diagnoses:  Active Problems:   Nausea and vomiting during pregnancy   GBS bacteriuria   Discharged Condition: good  Hospital Course: Patient admitted with hyperemesis and weight loss in pregnancy. This is not her first admission for this condition during this pregnancy. She received IV hydration and antiemetics as needed. She was able to tolerate a [employer] meals and denied further episodes of nausea or emesis. Patient was found stable for discharge home. She is scheduled for a prenatal appointment this week  Consults: None   Discharge Exam: Blood pressure (!) 96/54, pulse 63, temperature 98.2 F (36.8 C), temperature source Oral, resp. rate 15, height 5\' 7"  (1.702 m), weight 44.1 kg, last menstrual period 08/01/2020, SpO2 100 %. General appearance: alert, cooperative and no distress Resp: clear to auscultation bilaterally Cardio: regular rate and rhythm GI: soft, non-tender; bowel sounds normal; no masses,  no organomegaly Extremities: extremities normal, atraumatic, no cyanosis or edema and Homans sign is negative, no sign of DVT   Bedside ultrasound: Positive heart rate visualized by patient, nurse and myself  Disposition: Home There are no questions and answers to display.         Allergies as of 10/21/2020      Reactions   Sulfa Antibiotics Hives   Sumatriptan Succinate Nausea Only, Other (See Comments)   Other Reaction: OTHER REACTION-SOB, VOMI      Medication List    TAKE these medications   ondansetron 4 MG disintegrating tablet Commonly known as: Zofran ODT Take 1 tablet (4 mg total) by mouth every 6 (six) hours as needed for nausea.   pantoprazole 40 MG tablet Commonly known as: Protonix Take 1 tablet (40 mg total) by mouth daily.   sodium chloride 0.9 %  infusion Inject 10 mLs into the vein continuous.       Follow-up Information    [facility] Follow up.   Why: This week as scheduled Contact information: [address] [place] [phone]              Signed: [phone] 10/21/2020, 9:40 AM

## 2020-10-24 NOTE — Telephone Encounter (Signed)
Patient has been transferred to [facility] for remainder of pregnancy. Seen on 10/22/20 and has regularly scheduled f/u apts.

## 2020-10-25 ENCOUNTER — Encounter: Payer: BC Managed Care – PPO | Admitting: Obstetrics

## 2020-11-29 ENCOUNTER — Other Ambulatory Visit: Payer: BC Managed Care – PPO

## 2020-12-19 ENCOUNTER — Other Ambulatory Visit: Payer: Self-pay

## 2020-12-19 ENCOUNTER — Encounter: Payer: Self-pay | Admitting: Obstetrics and Gynecology

## 2020-12-19 ENCOUNTER — Observation Stay
Admission: EM | Admit: 2020-12-19 | Discharge: 2020-12-19 | Disposition: A | Discharge status: Home / Self Care | Payer: Medicaid Other | Attending: Obstetrics and Gynecology | Admitting: Obstetrics and Gynecology

## 2020-12-19 DIAGNOSIS — O368120 Decreased fetal movements, second trimester, not applicable or unspecified: Secondary | ICD-10-CM | POA: Diagnosis present

## 2020-12-19 DIAGNOSIS — R102 Pelvic and perineal pain unspecified side: Secondary | ICD-10-CM

## 2020-12-19 DIAGNOSIS — O26892 Other specified pregnancy related conditions, second trimester: Secondary | ICD-10-CM

## 2020-12-19 DIAGNOSIS — Z3A20 20 weeks gestation of pregnancy: Secondary | ICD-10-CM | POA: Insufficient documentation

## 2020-12-19 DIAGNOSIS — O368121 Decreased fetal movements, second trimester, fetus 1: Secondary | ICD-10-CM | POA: Diagnosis not present

## 2020-12-19 DIAGNOSIS — Z3481 Encounter for supervision of other normal pregnancy, first trimester: Secondary | ICD-10-CM

## 2020-12-19 LAB — URINALYSIS, ROUTINE W REFLEX MICROSCOPIC
Bilirubin Urine: NEGATIVE
Glucose, UA: NEGATIVE mg/dL
Hgb urine dipstick: NEGATIVE
Ketones, ur: NEGATIVE mg/dL
Nitrite: NEGATIVE
Protein, ur: NEGATIVE mg/dL
Specific Gravity, Urine: 1.015 (ref 1.005–1.030)
pH: 8.5 — ABNORMAL HIGH (ref 5.0–8.0)

## 2020-12-19 LAB — URINALYSIS, MICROSCOPIC (REFLEX): Bacteria, UA: NONE SEEN

## 2020-12-19 MED ORDER — NITROFURANTOIN MONOHYD MACRO 100 MG PO CAPS: 100.0000 mg | ORAL_CAPSULE | Freq: Two times a day (BID) | ORAL | 0 refills | Status: AC

## 2020-12-19 NOTE — Discharge Summary (Signed)
Please see Final Progress Note.  Mirna Mires, CNM  12/19/2020 11:52 AM

## 2020-12-19 NOTE — Discharge Instructions (Signed)
Keep you next scheduled appointment at [facility] for this afternoon at 3pm . Call your provider for any other concern.

## 2020-12-19 NOTE — OB Triage Note (Signed)
Pt arrived via EMS for complaints of decreased fetal movement, last felt movement at 8pm last night. Pt has had a good breakfast and tea and still no movement this am. Pt also states she has had abdominal pressure for about 30 min 5/10. Pt denies LOF, vag bleeding, or any other concerns. FHR 132, toco applied

## 2020-12-19 NOTE — Final Progress Note (Signed)
Final Progress Note  Patient ID: Penny Day MRN: [id number] DOB/AGE: Aug 18, 1986 35 y.o.  Admit date: 12/19/2020 Admitting provider: Conard Novak, MD Discharge date: 12/19/2020   Admission Diagnoses: Decreased fetal movement in second trimester Lower abdominal pain in pregnancy Pelvic pressure in second trimester of Pregnancy  Discharge Diagnoses:  Active Problems:   Decreased fetal movements in second trimester   Pelvic pressure in pregnancy, antepartum, second trimester Reassuring fetal heart tones Bladder spasms   History of Present Illness: The patient is a 35 y.o. female G3P1011 at [redacted]w[redacted]d who presents for evaluation of decreased fetal movement. She reports that an hour after arriving at work this morning, she noticed a lack of fetal movement. In addition , she felt some lower abdominal pain which she describes as"pressure".  Her employer called an ambulance and the patient, who is receiving care at [facility], was then transported to [facility].  She denies any leaking of fluid, denies any recent Intercourse or vaginal bleeding, and also denies any dysuria Or increased urgency to void.  Her prenatal course has thus far been higher risk, with multiple earlier visits for hyperemesis Gravidarum, insufficient weight gain, and recenly a dx of IUGR.Her initial care took place at [facility] in [place], but was transferred over some weeks ago to [facility].  Past Medical History:  Diagnosis Date  . Arthritis   . Endometriosis of uterus     Past Surgical History:  Procedure Laterality Date  . LAPAROSCOPY  2011  . TONSILLECTOMY      No current facility-administered medications on file prior to encounter.   Current Outpatient Medications on File Prior to Encounter  Medication Sig Dispense Refill  . calcium carbonate (TUMS - DOSED IN MG ELEMENTAL CALCIUM) 500 MG chewable tablet Chew 1 tablet by mouth daily.    . ondansetron (ZOFRAN ODT) 4 MG disintegrating  tablet Take 1 tablet (4 mg total) by mouth every 6 (six) hours as needed for nausea. 20 tablet 2  . pantoprazole (PROTONIX) 40 MG tablet Take 1 tablet (40 mg total) by mouth daily. 30 tablet 3  . sodium chloride 0.9 % infusion Inject 10 mLs into the vein continuous.      Allergies  Allergen Reactions  . Sulfa Antibiotics Hives  . Sumatriptan Succinate Nausea Only and Other (See Comments)    Other Reaction: OTHER REACTION-SOB, VOMI    Social History   Socioeconomic History  . Marital status: Married    Spouse name: Not on file  . Number of children: Not on file  . Years of education: Not on file  . Highest education level: Not on file  Occupational History  . Not on file  Tobacco Use  . Smoking status: Never Smoker  . Smokeless tobacco: Never Used  Vaping Use  . Vaping Use: Never used  Substance and Sexual Activity  . Alcohol use: No  . Drug use: No  . Sexual activity: Yes    Partners: Male    Birth control/protection: None  Other Topics Concern  . Not on file  Social History Narrative  . Not on file   Social Determinants of Health   Financial Resource Strain: Not on file  Food Insecurity: Not on file  Transportation Needs: Not on file  Physical Activity: Not on file  Stress: Not on file  Social Connections: Not on file  Intimate Partner Violence: Not on file    Family History  Problem Relation Age of Onset  . Cervical cancer  Mother 28  . Hypertension Mother   . Diabetes Mother   . Hypertension Maternal Grandmother   . Ovarian cancer Maternal Grandmother 30  . Prostate cancer Maternal Grandfather      Review of Systems  Constitutional: Positive for weight loss.  HENT: Negative.   Eyes: Negative.   Respiratory: Negative.   Cardiovascular: Negative.   Gastrointestinal: Negative.   Genitourinary:       The patient reports pelvic pressure that comes and goes.  Musculoskeletal: Negative.   Skin: Negative.   Neurological: Negative.    Endo/Heme/Allergies: Negative.   Psychiatric/Behavioral: Negative.      Physical Exam: LMP 08/01/2020 (Exact Date)   Physical Exam Constitutional:      Comments: Thin, latest weight 98 lbs per her report  Genitourinary:     Genitourinary Comments: Shaves entire mons and perineal area. Sterile speculum exam reveals closed cervix, scant white creamy discharge noted.  Normal vaginal rugae. No external rashes or lesions noted  HENT:     Head: Normocephalic and atraumatic.  Eyes:     Extraocular Movements: Extraocular movements intact.     Pupils: Pupils are equal, round, and reactive to light.  Cardiovascular:     Rate and Rhythm: Normal rate and regular rhythm.     Pulses: Normal pulses.     Heart sounds: Normal heart sounds.  Pulmonary:     Effort: Pulmonary effort is normal.     Breath sounds: Normal breath sounds.  Abdominal:     General: Abdomen is flat.     Palpations: Abdomen is soft.     Comments: FHTS 140-150. Gravid abdomen.  Musculoskeletal:        General: Normal range of motion.     Cervical back: Normal range of motion and neck supple.  Neurological:     General: No focal deficit present.     Mental Status: She is alert and oriented to person, place, and time.  Skin:    General: Skin is warm and dry.  Psychiatric:        Mood and Affect: Mood normal.     Consults: None  Significant Findings/ Diagnostic Studies: labs: UA (clean catch) see report.  RBC 0-5; WBC 6-10, no bacteria, Ca oxalate Crys Present; Moderate Leuks noted, no bacteria, negative nitrites, negative ketones, negative protein.  Procedures: auscultation of fetal heart tones using bedside monitor and hadn held doppler: FHTs easily auscultated: 140-150s. Audible fetal movements heard throughout auscultation. Tocometry  picks up no contractions; occaasional uterine "irritability " pattern noted.  Interpretation:  INDICATIONS: decreased fetal movement at [redacted] weeks gestation of  pregnancy.    Hospital Course: Fetal heart tones were easily auscultated on several occasions during her visit.  A clean catch urine was sent for UA.  A sterile speculum exam revealed a closed appearing cervix. Audible fetal movements noted during the auscultation of FHTs. Based on her UA results, plan is to start her on Macrobid for a possible UTI, and follow up on the Urine culture. She is reassured by the [facility] today, and also reports feeling some fetal movement duri g the visit. She has contacted [facility] and has an appointment this afternoon. Discharged home in care of her husband. A prescription for macrobid is sent to her pharmacy.  Discharge Condition: good  Disposition:  Discharge disposition: 01-Home or Self Care       Diet: Regular diet  Discharge Activity: Activity as tolerated. She plans a visit at [facility] later today.  Discharge Instructions  Discharge activity:  No Restrictions   Complete by: As directed    Discharge diet:  No restrictions   Complete by: As directed    No sexual activity restrictions   Complete by: As directed    Notify physician for a general feeling that "something is not right"   Complete by: As directed    Notify physician for increase or change in vaginal discharge   Complete by: As directed    Notify physician for intestinal cramps, with or without diarrhea, sometimes described as "gas pain"   Complete by: As directed    Notify physician for leaking of fluid   Complete by: As directed    Notify physician for low, dull backache, unrelieved by heat or Tylenol   Complete by: As directed    Notify physician for menstrual like cramps   Complete by: As directed    Notify physician for pelvic pressure   Complete by: As directed    Notify physician for uterine contractions.  These may be painless and feel like the uterus is tightening or the baby is  "balling up"   Complete by: As directed    Notify physician for vaginal bleeding   Complete by:  As directed    PRETERM LABOR:  Includes any of the follwing symptoms that occur between 20 - [redacted] weeks gestation.  If these symptoms are not stopped, preterm labor can result in preterm delivery, placing your baby at risk   Complete by: As directed      Allergies as of 12/19/2020      Reactions   Sulfa Antibiotics Hives   Sumatriptan Succinate Nausea Only, Other (See Comments)   Other Reaction: OTHER REACTION-SOB, VOMI          Total time spent taking care of this patient: 45 minutes in chart review, bedside examination, ordering of labs and reviewing results as well as education to the patient and her spouse.  Signed: Mirna Mires, CNM  12/19/2020, 12:20 PM

## 2021-02-03 ENCOUNTER — Other Ambulatory Visit: Payer: Self-pay | Admitting: Obstetrics and Gynecology

## 2021-02-12 IMAGING — US US ABDOMEN LIMITED
1 series · 15 of 25 positions shown · non-contrast
Comparison: None.

CLINICAL DATA: Hyperemia CIS

EXAM:
ULTRASOUND ABDOMEN LIMITED RIGHT UPPER QUADRANT

[Series 1: us abdomen limited · 15 of 63 slices shown]
[im 1/63]
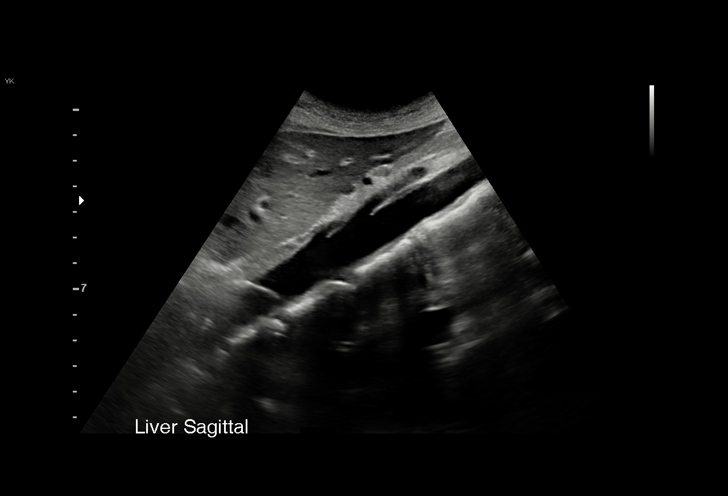
[im 6/63]
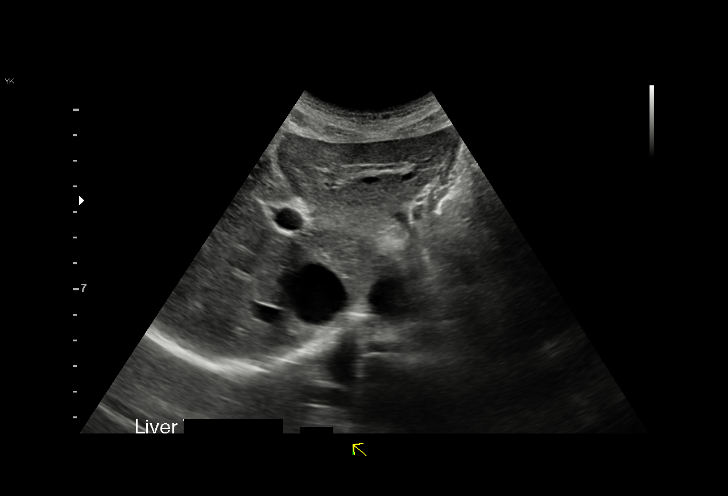
[im 11/63]
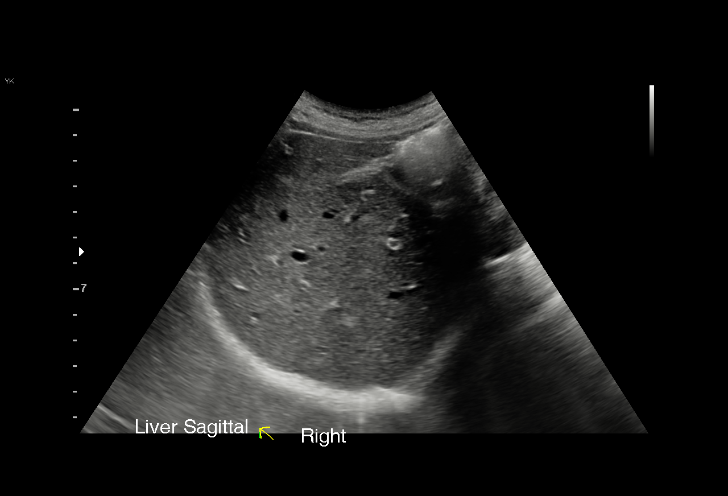
[im 13/63]
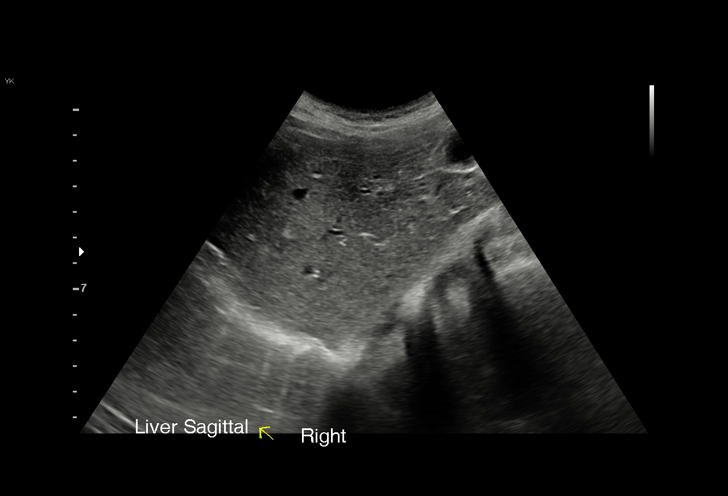
[im 19/63]
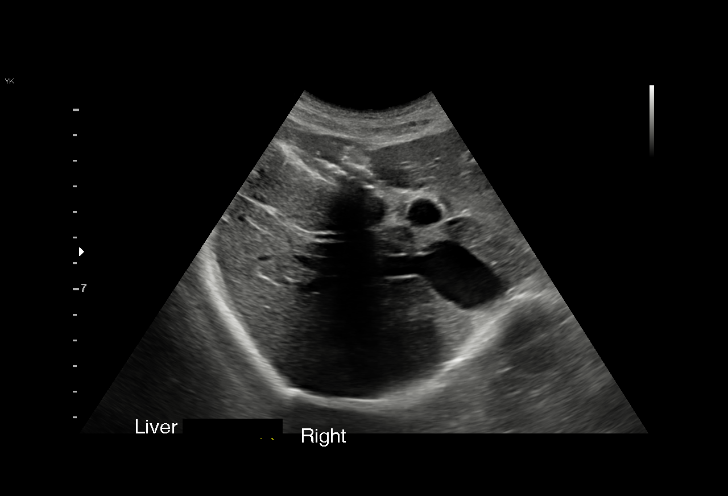
[im 24/63]
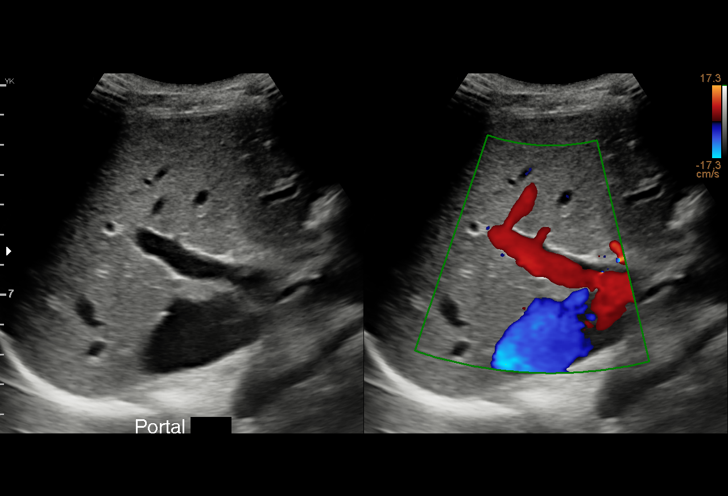
[im 26/63]
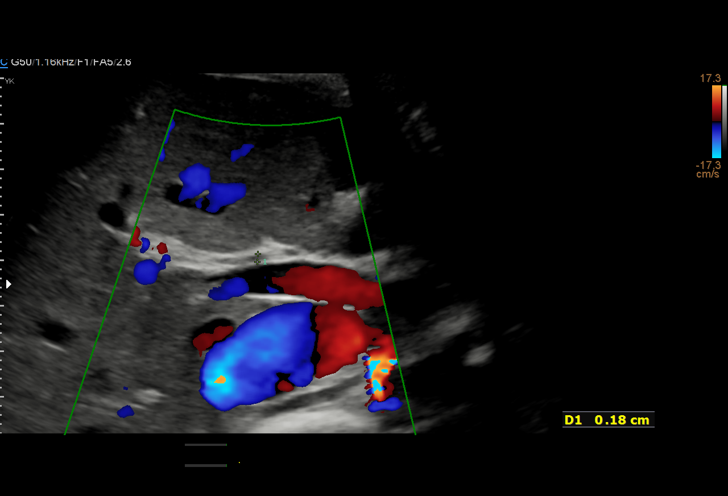
[im 32/63]
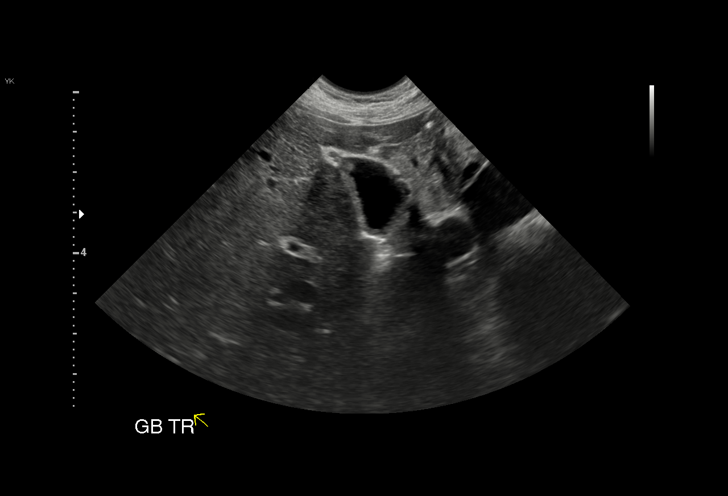
[im 37/63]
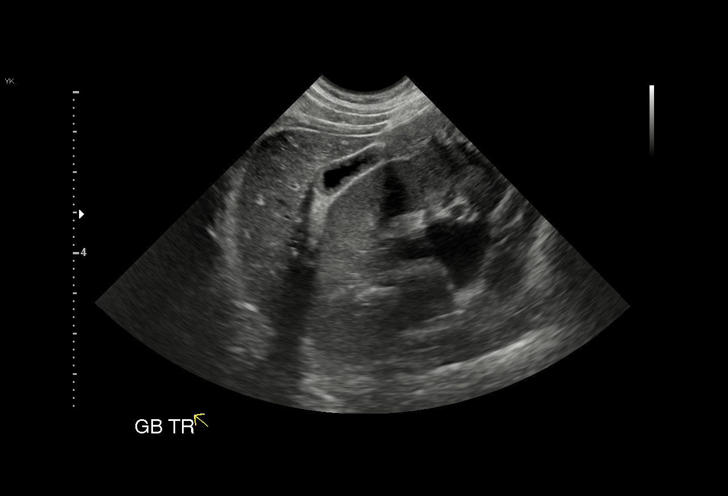
[im 39/63]
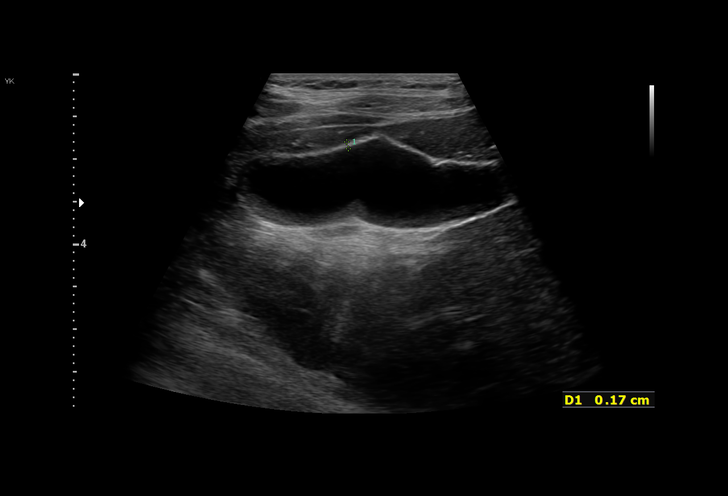
[im 44/63]
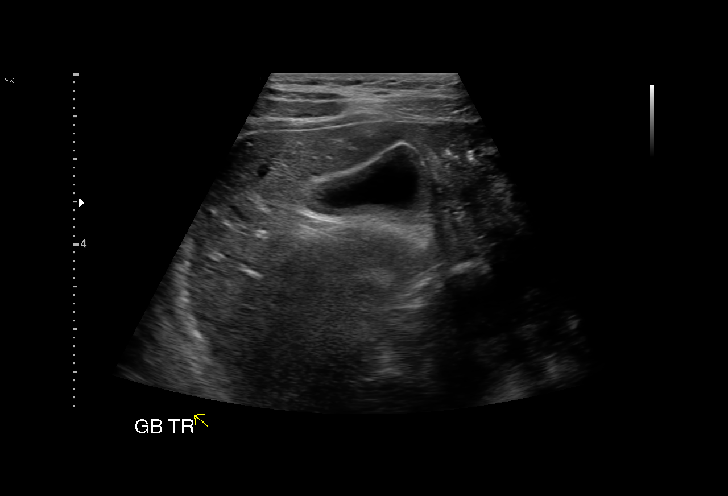
[im 50/63]
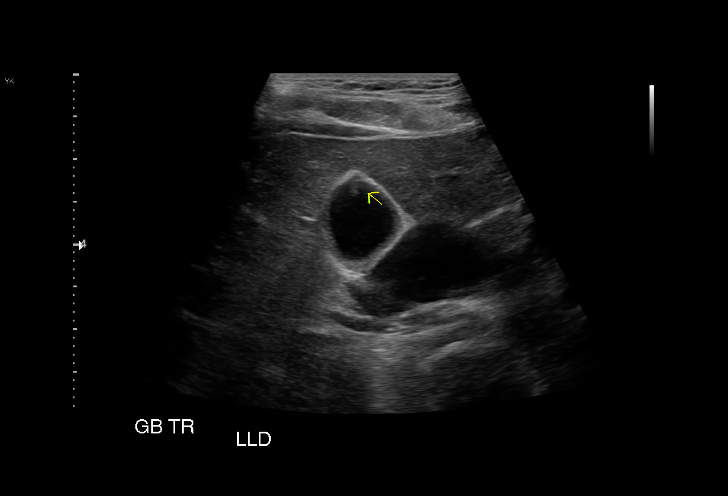
[im 52/63]
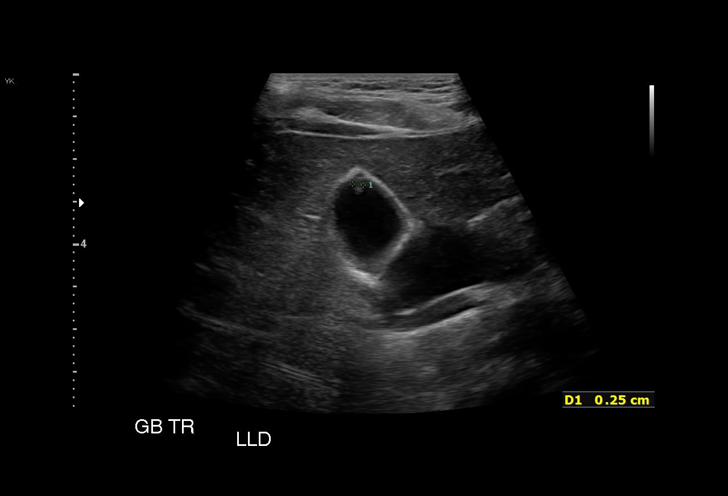
[im 57/63]
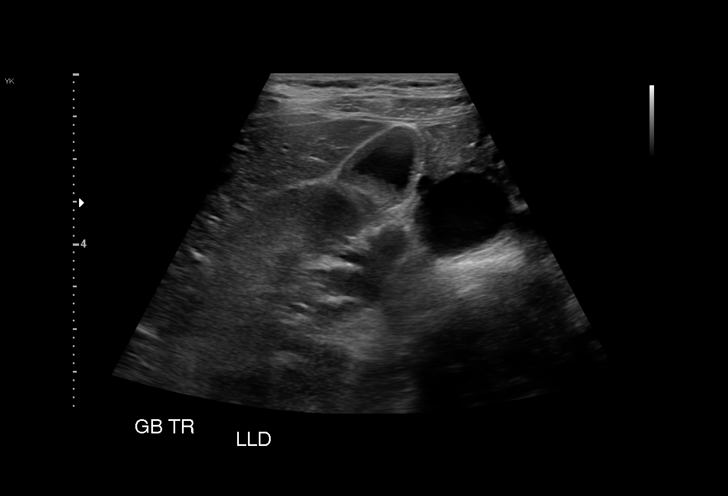
[im 63/63]
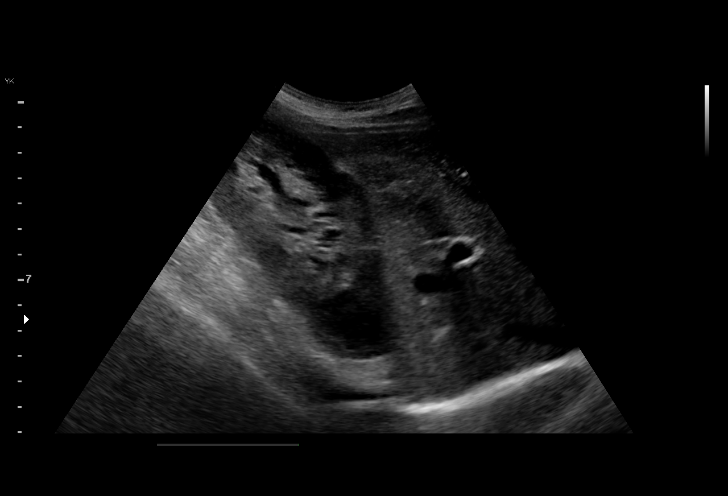

[15 of 25 positions shown; findings below may reference images not displayed]

FINDINGS: Gallbladder:

There is a 2.5 mm polyp in the gallbladder of no significance. No
wall thickening, pericholecystic fluid, stones, sludge, or Murphy's
sign.

Common bile duct:

Diameter: 1.8 mm

Liver:

No focal lesion identified. Within normal limits in parenchymal
echogenicity. Portal vein is patent on color Doppler imaging with
normal direction of blood flow towards the liver.

Other: None.
IMPRESSION: 1. No acute abnormalities. There is a 2.5 mm gallbladder polyp which
requires no follow-up.

## 2021-04-08 ENCOUNTER — Ambulatory Visit
Admission: EM | Admit: 2021-04-08 | Discharge: 2021-04-08 | Disposition: A | Discharge status: Home / Self Care | Payer: Medicaid Other | Attending: Physician Assistant | Admitting: Physician Assistant

## 2021-04-08 ENCOUNTER — Other Ambulatory Visit: Payer: Self-pay

## 2021-04-08 DIAGNOSIS — M25561 Pain in right knee: Secondary | ICD-10-CM | POA: Diagnosis not present

## 2021-04-08 DIAGNOSIS — H1032 Unspecified acute conjunctivitis, left eye: Secondary | ICD-10-CM | POA: Diagnosis not present

## 2021-04-08 MED ORDER — CIPROFLOXACIN HCL 0.3 % OP SOLN: 1.0000 [drp] | OPHTHALMIC | 0 refills | Status: AC

## 2021-04-08 NOTE — Discharge Instructions (Addendum)
We have given you a knee brace.  Take Tylenol for the discomfort.  Ice and elevate the knee.  If knee pain worsens or you notice swelling of your lower leg or you are not improving over the next week then you should be seen again.  For any severe acute changes, go to the emergency department or call 911.  I have sent in an eyedrop for possible pinkeye.

## 2021-04-08 NOTE — ED Triage Notes (Signed)
Pt c/o right ankle swelling up to her knee that started Sunday night. Also c/o bilateral eye issue with drainage.

## 2021-04-08 NOTE — ED Provider Notes (Signed)
[facility]    CSN: [id number] Arrival date & time: 04/08/21  1607      History   Chief Complaint Chief Complaint  Patient presents with  . Leg Swelling    HPI Penny Day is a 35 y.o. female who is 35 to [redacted] weeks pregnant.  Patient is presenting today for right knee pain and swelling for the past couple of days.  Patient denies any injury.  She says pain is about 6 out of 10.  She has kept it elevated but has not taken any Tylenol for pain relief or iced it.  She says she is unsure of why it has been hurting her.  Denies any change in activity recently.  No falls.  No history of any knee problems.  No swelling of the lower leg or calf pain.  Patient also presenting for yellowish discharge from the left eye for the past couple of days.  She says she also has on the right eye but its not as bad.  Patient also admits to nasal congestion and sinus pressure for the past 3 weeks.  Has not taken any OTC medications for this.  No fevers.  No chest pain or breathing difficulty.  No other concerns.  HPI  Past Medical History:  Diagnosis Date  . Arthritis   . Endometriosis of uterus     Patient Active Problem List   Diagnosis Date Noted  . Decreased fetal movements in second trimester 12/19/2020  . Pelvic pressure in pregnancy, antepartum, second trimester   . Nausea and vomiting during pregnancy 10/19/2020  . GBS bacteriuria 10/19/2020  . Severe protein-calorie malnutrition (HCC) 10/16/2020  . Hyperemesis gravidarum 10/09/2020  . Adjustment disorder with anxiety 10/09/2020  . Protein-calorie malnutrition, severe 10/09/2020  . Weight loss   . Intractable vomiting   . Supervision of normal pregnancy 09/25/2020  . Endometriosis determined by laparoscopy 05/02/2009    Past Surgical History:  Procedure Laterality Date  . LAPAROSCOPY  2011  . TONSILLECTOMY      OB History    Gravida  3   Para  1   Term  1   Preterm      AB  1   Living  1     SAB  1    IAB      Ectopic      Multiple      Live Births  1            Home Medications    Prior to Admission medications   Medication Sig Start Date End Date Taking? Authorizing Provider  ciprofloxacin (CILOXAN) 0.3 % ophthalmic solution Place 1 drop into the left eye every 2 (two) hours. Administer 1 drop, every 2 hours, while awake, for 2 days. Then 1 drop, every 4 hours, while awake, for the next 5 days. 04/08/21  Yes Eusebio Friendly B, PA-C  ondansetron (ZOFRAN ODT) 4 MG disintegrating tablet Take 1 tablet (4 mg total) by mouth every 6 (six) hours as needed for nausea. 09/25/20  Yes Tresea Mall, CNM  pantoprazole (PROTONIX) 40 MG tablet Take 1 tablet (40 mg total) by mouth daily. 10/21/20  Yes Constant, Peggy, MD  calcium carbonate (TUMS - DOSED IN MG ELEMENTAL CALCIUM) 500 MG chewable tablet Chew 1 tablet by mouth daily.    [provider]  sodium chloride 0.9 % infusion Inject 10 mLs into the vein continuous. 10/09/20   Conard Novak, MD    Family History Family History  Problem Relation Age of Onset  . Cervical cancer Mother 48  . Hypertension Mother   . Diabetes Mother   . Hypertension Maternal Grandmother   . Ovarian cancer Maternal Grandmother 30  . Prostate cancer Maternal Grandfather     Social History Social History   Tobacco Use  . Smoking status: Never Smoker  . Smokeless tobacco: Never Used  Vaping Use  . Vaping Use: Never used  Substance Use Topics  . Alcohol use: No  . Drug use: No     Allergies   Sulfa antibiotics and Sumatriptan succinate   Review of Systems Review of Systems  Constitutional: Negative for chills, diaphoresis, fatigue and fever.  HENT: Positive for congestion, rhinorrhea and sinus pressure. Negative for ear pain, sinus pain and sore throat.   Respiratory: Positive for cough. Negative for shortness of breath.   Cardiovascular: Negative for chest pain.  Gastrointestinal: Negative for abdominal pain, nausea and  vomiting.  Musculoskeletal: Positive for arthralgias and joint swelling. Negative for myalgias.  Skin: Negative for rash.  Neurological: Negative for weakness and headaches.  Hematological: Negative for adenopathy.     Physical Exam Triage Vital Signs ED Triage Vitals  Enc Vitals Group     BP 04/08/21 0945 105/81     Pulse Rate 04/08/21 0945 (!) 115     Resp 04/08/21 0945 16     Temp 04/08/21 0945 97.7 F (36.5 C)     Temp Source 04/08/21 0945 Oral     SpO2 04/08/21 0945 100 %     Weight 04/08/21 0944 130 lb (59 kg)     Height --      Head Circumference --      Peak Flow --      Pain Score 04/08/21 0944 6     Pain Loc --      Pain Edu? --      Excl. in GC? --    No data found.  Updated Vital Signs BP 105/81 (BP Location: Left Arm)   Pulse (!) 115   Temp 97.7 F (36.5 C) (Oral)   Resp 16   Wt 130 lb (59 kg)   LMP 08/01/2020 (Exact Date)   SpO2 100%   BMI 20.36 kg/m   Visual Acuity Right Eye Distance: 20/20 (uncorrected) Left Eye Distance: 20/20 (uncorrected) Bilateral Distance: 20/20 (uncorrected)  Right Eye Near:   Left Eye Near:    Bilateral Near:     Physical Exam Vitals and nursing note reviewed.  Constitutional:      General: She is not in acute distress.    Appearance: Normal appearance. She is not ill-appearing or toxic-appearing.  HENT:     Head: Normocephalic and atraumatic.     Nose: Congestion and rhinorrhea present.     Mouth/Throat:     Mouth: Mucous membranes are moist.     Pharynx: Oropharynx is clear. No posterior oropharyngeal erythema.  Eyes:     General: No scleral icterus.       Right eye: No discharge.        Left eye: No discharge.     Conjunctiva/sclera:     Left eye: Left conjunctiva is injected.  Cardiovascular:     Rate and Rhythm: Normal rate and regular rhythm.     Heart sounds: Normal heart sounds.  Pulmonary:     Effort: Pulmonary effort is normal. No respiratory distress.     Breath sounds: Normal breath sounds.   Musculoskeletal:     Cervical back: Neck supple.  Right knee: No swelling. Normal range of motion. Tenderness (superior anterior knee and quadriceps muscle) present.     Comments: No leg swelling noted.  No calf tenderness, erythema, or warmth.  Full range of motion the knee but does have pain with full extension.  Skin:    General: Skin is dry.  Neurological:     General: No focal deficit present.     Mental Status: She is alert. Mental status is at baseline.     Motor: No weakness.     Gait: Gait normal.  Psychiatric:        Mood and Affect: Mood normal.        Behavior: Behavior normal.        Thought Content: Thought content normal.      UC Treatments / Results  Labs (all labs ordered are listed, but only abnormal results are displayed) Labs Reviewed - No data to display  EKG   Radiology No results found.  Procedures Procedures (including critical care time)  Medications Ordered in UC Medications - No data to display  Initial Impression / Assessment and Plan / UC Course  I have reviewed the triage vital signs and the nursing notes.  Pertinent labs & imaging results that were available during my care of the patient were reviewed by me and considered in my medical decision making (see chart for details).   35 year old pregnant female presenting for right knee pain and concerns about possible pinkeye.  She has no swelling of the affected knee.  Full range of motion but does have pain with it.  Tenderness of the superior anterior knee and quadriceps tendon.  Patient given soft knee brace and advised Tylenol and RICE.  Symptoms could be related to, problems associated with pregnancy.  Advised to follow-up for any worsening symptoms.  Low suspicion for conjunctivitis, but will provide prescription for Cipro eyedrops.  I did offer her an antibiotic since she has been sick for 3 weeks, but she declined.  Advised to continue with supportive care.  Advised to follow-up with  [place] as needed.  ED precautions for congestion/cough and joint aches reviewed with patient.   Final Clinical Impressions(s) / UC Diagnoses   Final diagnoses:  Acute pain of right knee  Acute conjunctivitis of left eye, unspecified acute conjunctivitis type     Discharge Instructions     We have given you a knee brace.  Take Tylenol for the discomfort.  Ice and elevate the knee.  If knee pain worsens or you notice swelling of your lower leg or you are not improving over the next week then you should be seen again.  For any severe acute changes, go to the emergency department or call 911.  I have sent in an eyedrop for possible pinkeye.    ED Prescriptions    Medication Sig Dispense Auth. Provider   ciprofloxacin (CILOXAN) 0.3 % ophthalmic solution Place 1 drop into the left eye every 2 (two) hours. Administer 1 drop, every 2 hours, while awake, for 2 days. Then 1 drop, every 4 hours, while awake, for the next 5 days. 5 mL Shirlee Latch, PA-C     PDMP not reviewed this encounter.   Shirlee Latch, PA-C 04/08/21 1036

## 2021-04-10 ENCOUNTER — Ambulatory Visit
Admission: EM | Admit: 2021-04-10 | Discharge: 2021-04-10 | Disposition: A | Discharge status: Home / Self Care | Payer: Medicaid Other | Attending: Family Medicine | Admitting: Family Medicine

## 2021-04-10 ENCOUNTER — Encounter: Payer: Self-pay | Admitting: Emergency Medicine

## 2021-04-10 ENCOUNTER — Other Ambulatory Visit: Payer: Self-pay

## 2021-04-10 DIAGNOSIS — R0781 Pleurodynia: Secondary | ICD-10-CM | POA: Diagnosis not present

## 2021-04-10 NOTE — Discharge Instructions (Signed)
Use your cough medicine from OB.  Apply heat to the area.  Tylenol 1000 mg 3 times daily as needed.  Follow up with OB.  Take care  Dr. Adriana Simas

## 2021-04-10 NOTE — ED Triage Notes (Signed)
Patient c/o left side rib pain from coughing that started Tuesday night.

## 2021-04-10 NOTE — ED Provider Notes (Addendum)
[facility]    CSN: [id number] Arrival date & time: 04/10/21  0865      History   Chief Complaint Chief Complaint  Patient presents with  . Muscle Pain   HPI  35 year old female presents with left-sided rib pain and cough.  She states that it started on Tuesday.  She reports ongoing cough.  She reports left-sided lateral rib pain.  She believes this is from the cough.  She is taking cough medicine provided by her OB/GYN.  She states that her pain is 8/10 in severity.  Described as aching.  No relieving factors.  Patient is currently [redacted] weeks pregnant.  Her pain seems to be worse with cough.  No other complaints or concerns at this time.   Past Medical History:  Diagnosis Date  . Arthritis   . Endometriosis of uterus     Patient Active Problem List   Diagnosis Date Noted  . Decreased fetal movements in second trimester 12/19/2020  . Pelvic pressure in pregnancy, antepartum, second trimester   . Nausea and vomiting during pregnancy 10/19/2020  . GBS bacteriuria 10/19/2020  . Severe protein-calorie malnutrition (HCC) 10/16/2020  . Hyperemesis gravidarum 10/09/2020  . Adjustment disorder with anxiety 10/09/2020  . Protein-calorie malnutrition, severe 10/09/2020  . Weight loss   . Intractable vomiting   . Supervision of normal pregnancy 09/25/2020  . Endometriosis determined by laparoscopy 05/02/2009    Past Surgical History:  Procedure Laterality Date  . LAPAROSCOPY  2011  . TONSILLECTOMY      OB History    Gravida  3   Para  1   Term  1   Preterm      AB  1   Living  1     SAB  1   IAB      Ectopic      Multiple      Live Births  1            Home Medications    Prior to Admission medications   Medication Sig Start Date End Date Taking? Authorizing Provider  calcium carbonate (TUMS - DOSED IN MG ELEMENTAL CALCIUM) 500 MG chewable tablet Chew 1 tablet by mouth daily.   Yes [provider]  ciprofloxacin (CILOXAN)  0.3 % ophthalmic solution Place 1 drop into the left eye every 2 (two) hours. Administer 1 drop, every 2 hours, while awake, for 2 days. Then 1 drop, every 4 hours, while awake, for the next 5 days. 04/08/21  Yes Eusebio Friendly B, PA-C  ondansetron (ZOFRAN ODT) 4 MG disintegrating tablet Take 1 tablet (4 mg total) by mouth every 6 (six) hours as needed for nausea. 09/25/20  Yes Tresea Mall, CNM  pantoprazole (PROTONIX) 40 MG tablet Take 1 tablet (40 mg total) by mouth daily. 10/21/20  Yes Constant, Peggy, MD  sodium chloride 0.9 % infusion Inject 10 mLs into the vein continuous. 10/09/20  Yes Conard Novak, MD    Family History Family History  Problem Relation Age of Onset  . Cervical cancer Mother 59  . Hypertension Mother   . Diabetes Mother   . Hypertension Maternal Grandmother   . Ovarian cancer Maternal Grandmother 30  . Prostate cancer Maternal Grandfather     Social History Social History   Tobacco Use  . Smoking status: Never Smoker  . Smokeless tobacco: Never Used  Vaping Use  . Vaping Use: Never used  Substance Use Topics  . Alcohol use: No  . Drug use:  No     Allergies   Sulfa antibiotics and Sumatriptan succinate   Review of Systems Review of Systems Per HPI  Physical Exam Triage Vital Signs ED Triage Vitals  Enc Vitals Group     BP 04/10/21 0844 115/75     Pulse Rate 04/10/21 0844 91     Resp 04/10/21 0844 18     Temp 04/10/21 0844 98.2 F (36.8 C)     Temp Source 04/10/21 0844 Oral     SpO2 04/10/21 0844 100 %     Weight 04/10/21 0842 130 lb 1.1 oz (59 kg)     Height 04/10/21 0842 5\' 7"  (1.702 m)     Head Circumference --      Peak Flow --      Pain Score 04/10/21 0842 8     Pain Loc --      Pain Edu? --      Excl. in GC? --    Updated Vital Signs BP 115/75 (BP Location: Left Arm)   Pulse 91   Temp 98.2 F (36.8 C) (Oral)   Resp 18   Ht 5\' 7"  (1.702 m)   Wt 59 kg   LMP 08/01/2020 (Exact Date)   SpO2 100%   BMI 20.37 kg/m    Visual Acuity Right Eye Distance:   Left Eye Distance:   Bilateral Distance:    Right Eye Near:   Left Eye Near:    Bilateral Near:     Physical Exam Constitutional:      General: She is not in acute distress.    Appearance: Normal appearance. She is not ill-appearing.  HENT:     Head: Normocephalic and atraumatic.  Cardiovascular:     Rate and Rhythm: Normal rate and regular rhythm.  Pulmonary:     Effort: Pulmonary effort is normal.     Breath sounds: No wheezing, rhonchi or rales.  Chest:       Comments: Lateral rib tenderness to palpation (labelled location). Neurological:     Mental Status: She is alert.  Psychiatric:        Mood and Affect: Mood normal.        Behavior: Behavior normal.      UC Treatments / Results  Labs (all labs ordered are listed, but only abnormal results are displayed) Labs Reviewed - No data to display  EKG   Radiology No results found.  Procedures Procedures (including critical care time)  Medications Ordered in UC Medications - No data to display  Initial Impression / Assessment and Plan / UC Course  I have reviewed the triage vital signs and the nursing notes.  Pertinent labs & imaging results that were available during my care of the patient were reviewed by me and considered in my medical decision making (see chart for details).    35 year old female presents with ear pain.  This is musculoskeletal in nature and likely from the cough.  As she is currently pregnant, there is little that can be done from a medical standpoint.  Advised heat and Tylenol as directed.  Follow-up with OB/GYN.  Supportive care.  Final Clinical Impressions(s) / UC Diagnoses   Final diagnoses:  Rib pain     Discharge Instructions     Use your cough medicine from OB.  Apply heat to the area.  Tylenol 1000 mg 3 times daily as needed.  Follow up with OB.  Take care  Dr. 08/03/2020    ED Prescriptions    None  PDMP not reviewed  this encounter.    Everlene Other [place], [place] 04/10/21 [phone]

## 2023-03-18 ENCOUNTER — Ambulatory Visit
Admission: EM | Admit: 2023-03-18 | Discharge: 2023-03-18 | Disposition: A | Discharge status: Home / Self Care | Payer: BC Managed Care – PPO | Attending: Family Medicine | Admitting: Family Medicine

## 2023-03-18 DIAGNOSIS — S161XXA Strain of muscle, fascia and tendon at neck level, initial encounter: Secondary | ICD-10-CM | POA: Diagnosis not present

## 2023-03-18 DIAGNOSIS — S39012A Strain of muscle, fascia and tendon of lower back, initial encounter: Secondary | ICD-10-CM | POA: Diagnosis not present

## 2023-03-18 MED ORDER — METHOCARBAMOL 500 MG PO TABS: 500.0000 mg | ORAL_TABLET | Freq: Two times a day (BID) | ORAL | 0 refills | Status: AC

## 2023-03-18 MED ORDER — NAPROXEN 500 MG PO TABS: 500.0000 mg | ORAL_TABLET | Freq: Two times a day (BID) | ORAL | 0 refills | Status: AC
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
## 2024-12-07 ENCOUNTER — Telehealth: Admitting: Physician Assistant

## 2024-12-07 ENCOUNTER — Encounter

## 2024-12-07 DIAGNOSIS — K047 Periapical abscess without sinus: Secondary | ICD-10-CM | POA: Diagnosis not present

## 2024-12-07 MED ORDER — AMOXICILLIN-POT CLAVULANATE 875-125 MG PO TABS: 1.0000 | ORAL_TABLET | Freq: Two times a day (BID) | ORAL | 0 refills | Status: AC

## 2024-12-07 NOTE — Patient Instructions (Signed)
 " Laymon JONETTA Hacking, thank you for joining Delon CHRISTELLA Dickinson, PA-C for today's virtual visit.  While this provider is not your primary care provider (PCP), if your PCP is located in our provider database this encounter information will be shared with them immediately following your visit.   A [facility] MyChart account gives you access to today's visit and all your visits, tests, and labs performed at [facility]  click here if you don't have a [facility] MyChart account or go to [email or web address]  Consent: (Patient) Laymon JONETTA Hacking provided verbal consent for this virtual visit at the beginning of the encounter.  Current Medications:  Current Outpatient Medications:    amoxicillin -clavulanate (AUGMENTIN ) 875-125 MG tablet, Take 1 tablet by mouth 2 (two) times daily., Disp: 14 tablet, Rfl: 0   calcium  carbonate (TUMS - DOSED IN MG ELEMENTAL CALCIUM ) 500 MG chewable tablet, Chew 1 tablet by mouth daily., Disp: , Rfl:    ciprofloxacin  (CILOXAN ) 0.3 % ophthalmic solution, Place 1 drop into the left eye every 2 (two) hours. Administer 1 drop, every 2 hours, while awake, for 2 days. Then 1 drop, every 4 hours, while awake, for the next 5 days., Disp: 5 mL, Rfl: 0   methocarbamol  (ROBAXIN ) 500 MG tablet, Take 1 tablet (500 mg total) by mouth 2 (two) times daily., Disp: 20 tablet, Rfl: 0   naproxen  (NAPROSYN ) 500 MG tablet, Take 1 tablet (500 mg total) by mouth 2 (two) times daily with a meal., Disp: 30 tablet, Rfl: 0   ondansetron  (ZOFRAN  ODT) 4 MG disintegrating tablet, Take 1 tablet (4 mg total) by mouth every 6 (six) hours as needed for nausea., Disp: 20 tablet, Rfl: 2   pantoprazole  (PROTONIX ) 40 MG tablet, Take 1 tablet (40 mg total) by mouth daily., Disp: 30 tablet, Rfl: 3   sodium chloride  0.9 % infusion, Inject 10 mLs into the vein continuous., Disp: , Rfl:    Medications ordered in this encounter:  Meds ordered this encounter  Medications   amoxicillin -clavulanate  (AUGMENTIN ) 875-125 MG tablet    Sig: Take 1 tablet by mouth 2 (two) times daily.    Dispense:  14 tablet    Refill:  0    Supervising Provider:   BLAISE ALEENE KIDD [[id number]]     *If you need refills on other medications prior to your next appointment, please contact your pharmacy*  Follow-Up: Call back or seek an in-person evaluation if the symptoms worsen or if the condition fails to improve as anticipated.  [facility] [phone]  Other Instructions Dental Abscess  A dental abscess is an infection around a tooth that may involve pain, swelling, and a collection of pus, as well as other symptoms. Treatment is important to help with symptoms and to prevent the infection from spreading. The general types of dental abscesses are: Pulpal abscess. This abscess may form from the inner part of the tooth (pulp). Periodontal abscess. This abscess may form from the gum. What are the causes? This condition is caused by a bacterial infection in or around the tooth. It may result from: Severe tooth decay (cavities). Trauma to the tooth, such as a broken or chipped tooth. What increases the risk? This condition is more likely to develop in males. It is also more likely to develop in people who: Have cavities. Have severe gum disease. Eat sugary snacks between meals. Use tobacco products. Have diabetes. Have a weakened disease-fighting system (immune system). Do not brush and care for their  teeth regularly. What are the signs or symptoms? Mild symptoms of this condition include: Tenderness. Bad breath. Fever. A bitter taste in the mouth. Pain in and around the infected tooth. Moderate symptoms of this condition include: Swollen neck glands. Chills. Pus drainage. Swelling and redness around the infected tooth, in the mouth, or in the face. Severe pain in and around the infected tooth. Severe symptoms of this condition include: Difficulty swallowing. Difficulty  opening the mouth. Nausea. Vomiting. How is this diagnosed? This condition is diagnosed based on: Your symptoms and your medical and dental history. An examination of the infected tooth. During the exam, your dental care provider may tap on the infected tooth. You may also need to have X-rays taken of the affected area. How is this treated? This condition is treated by getting rid of the infection. This may be done with: Antibiotic medicines. These may be used in certain situations. Antibacterial mouth rinse. Incision and drainage. This procedure is done by making an incision in the abscess to drain out the pus. Removing pus is the first priority in treating an abscess. A root canal. This may be performed to save the tooth. Your dental care provider accesses the visible part of your tooth (crown) with a drill and removes any infected pulp. Then the space is filled and sealed off. Tooth extraction. The tooth is pulled out if it cannot be saved by other treatment. You may also receive treatment for pain, such as: Acetaminophen  or NSAIDs. Gels that contain a numbing medicine. An injection to block the pain near your nerve. Follow these instructions at home: Medicines Take over-the-counter and prescription medicines only as told by your dental care provider. If you were prescribed an antibiotic, take it as told by your dental care provider. Do not stop taking the antibiotic even if you start to feel better. If you were prescribed a gel that contains a numbing medicine, use it exactly as told in the directions. Do not use these gels for children who are younger than 62 years of age. Use an antibacterial mouth rinse as told by your dental care provider. General instructions  Gargle with a mixture of salt and water 3-4 times a day or as needed. To make salt water, completely dissolve -1 tsp (3-6 g) of salt in 1 cup (237 mL) of warm water. Eat a soft diet while your abscess is healing. Drink  enough fluid to keep your urine pale yellow. Do not apply heat to the outside of your mouth. Do not use any products that contain nicotine or tobacco. These products include cigarettes, chewing tobacco, and vaping devices, such as e-cigarettes. If you need help quitting, ask your dental care provider. Keep all follow-up visits. This is important. How is this prevented?  Excellent dental home care, which includes brushing your teeth every morning and night with fluoride toothpaste. Floss one time each day. Get regularly scheduled dental cleanings. Consider having a dental sealant applied on teeth that have deep grooves to prevent cavities. Drink fluoridated water regularly. This includes most tap water. Check the label on bottled water to see if it contains fluoride. Reduce or eliminate sugary drinks. Eat healthy meals and snacks. Wear a mouth guard or face shield to protect your teeth while playing sports. Contact a health care provider if: Your pain is worse and is not helped by medicine. You have swelling. You see pus around the tooth. You have a fever or chills. Get help right away if: Your symptoms suddenly get  worse. You have a very bad headache. You have problems breathing or swallowing. You have trouble opening your mouth. You have swelling in your neck or around your eye. These symptoms may represent a serious problem that is an emergency. Do not wait to see if the symptoms will go away. Get medical help right away. Call your local emergency services (911 in the [place] Do not drive yourself to the hospital. Summary A dental abscess is a collection of pus in or around a tooth that results from an infection. A dental abscess may result from severe tooth decay, trauma to the tooth, or severe gum disease around a tooth. Symptoms include severe pain, swelling, redness, and drainage of pus in and around the infected tooth. The first priority in treating a dental abscess is to drain out  the pus. Treatment may also involve removing damage inside the tooth (root canal) or extracting the tooth. This information is not intended to replace advice given to you by your health care provider. Make sure you discuss any questions you have with your health care provider. Document Revised: 01/09/2021 Document Reviewed: 01/09/2021 Elsevier Patient Education  2024 [employer]   If you have been instructed to have an in-person evaluation today at a local Urgent Care facility, please use the link below. It will take you to a list of all of our available [facility], including address, phone number and hours of operation. Please do not delay care.  [facility]  If you or a family member do not have a primary care provider, use the link below to schedule a visit and establish care. When you choose a [facility] primary care physician or advanced practice provider, you gain a long-term partner in health. Find a Primary Care Provider  Learn more about [facility]'s in-office and virtual care options: [facility] - Get Care Now "

## 2024-12-07 NOTE — Progress Notes (Signed)
 " Virtual Visit Consent   AVERILL Day, you are scheduled for a virtual visit with a [facility] provider today. Just as with appointments in the office, your consent must be obtained to participate. Your consent will be active for this visit and any virtual visit you may have with one of our providers in the next 365 days. If you have a MyChart account, a copy of this consent can be sent to you electronically.  As this is a virtual visit, video technology does not allow for your provider to perform a traditional examination. This may limit your provider's ability to fully assess your condition. If your provider identifies any concerns that need to be evaluated in person or the need to arrange testing (such as labs, EKG, etc.), we will make arrangements to do so. Although advances in technology are sophisticated, we cannot ensure that it will always work on either your end or our end. If the connection with a video visit is poor, the visit may have to be switched to a telephone visit. With either a video or telephone visit, we are not always able to ensure that we have a secure connection.  By engaging in this virtual visit, you consent to the provision of healthcare and authorize for your insurance to be billed (if applicable) for the services provided during this visit. Depending on your insurance coverage, you may receive a charge related to this service.  I need to obtain your verbal consent now. Are you willing to proceed with your visit today? Penny Day has provided verbal consent on 12/07/2024 for a virtual visit (video or telephone). Delon CHRISTELLA Dickinson, PA-C  Date: 12/07/2024 9:01 AM   Virtual Visit via Video Note   I, Delon CHRISTELLA Dickinson, connected with  Penny Day  ([id number], 1985/12/12) on 12/07/24 at  8:45 AM EST by a video-enabled telemedicine application and verified that I am speaking with the correct person using two identifiers.  Location: Patient: Virtual Visit  Location Patient: Home Provider: Virtual Visit Location Provider: Home Office   I discussed the limitations of evaluation and management by telemedicine and the availability of in person appointments. The patient expressed understanding and agreed to proceed.    History of Present Illness: Penny Day is a 39 y.o. who identifies as a female who was assigned female at birth, and is being seen today for dental infection.  HPI: Dental Pain  This is a new problem. The current episode started 1 to 4 weeks ago (started hurting a week ago, but acutely worsened last night). The problem occurs constantly. The problem has been rapidly worsening. The pain is moderate. Associated symptoms include facial pain and thermal sensitivity. Pertinent negatives include no difficulty swallowing, fever, oral bleeding or sinus pressure. Associated symptoms comments: Throbbing. She has tried acetaminophen  for the symptoms. The treatment provided mild relief.     Problems:  Patient Active Problem List   Diagnosis Date Noted   Decreased fetal movements in second trimester 12/19/2020   Pelvic pressure in pregnancy, antepartum, second trimester    Nausea and vomiting during pregnancy 10/19/2020   GBS bacteriuria 10/19/2020   Severe protein-calorie malnutrition 10/16/2020   Hyperemesis gravidarum 10/09/2020   Adjustment disorder with anxiety 10/09/2020   Protein-calorie malnutrition, severe 10/09/2020   Weight loss    Intractable vomiting    Supervision of normal pregnancy 09/25/2020   Endometriosis determined by laparoscopy 05/02/2009    Allergies: Allergies[1] Medications: Current Medications[2]  Observations/Objective: Patient is well-developed, well-nourished  in no acute distress.  Resting comfortably at home.  Head is normocephalic, atraumatic.  No labored breathing.  Speech is clear and coherent with logical content.  Patient is alert and oriented at baseline.    Assessment and Plan: 1. Dental  infection (Primary) - amoxicillin -clavulanate (AUGMENTIN ) 875-125 MG tablet; Take 1 tablet by mouth 2 (two) times daily.  Dispense: 14 tablet; Refill: 0  - Suspected infection with broken tooth - Augmentin  prescribed - Can use ice on outside jaw/cheek for swelling - Can also take tylenol  for pain with other medications - Discussed DenTemp putty that can be used to cover a broken tooth - Discussed oil of clove or making a clove paste using ground clove and a few drops of water to apply to affected tooth for pain management - Schedule a follow with a dentist as soon as possible (Can contact [facility] if underinsured or uninsured) - Seek in person evaluation if symptoms fail to improve or if they worsen   Follow Up Instructions: I discussed the assessment and treatment plan with the patient. The patient was provided an opportunity to ask questions and all were answered. The patient agreed with the plan and demonstrated an understanding of the instructions.  A copy of instructions were sent to the patient via MyChart unless otherwise noted below.    The patient was advised to call back or seek an in-person evaluation if the symptoms worsen or if the condition fails to improve as anticipated.    Delon CHRISTELLA Dickinson, PA-C     [1]  Allergies Allergen Reactions   Sulfa Antibiotics Hives   Sumatriptan Succinate Nausea Only and Other (See Comments)    Other Reaction: OTHER REACTION-SOB, VOMI  [2]  Current Outpatient Medications:    amoxicillin -clavulanate (AUGMENTIN ) 875-125 MG tablet, Take 1 tablet by mouth 2 (two) times daily., Disp: 14 tablet, Rfl: 0   calcium  carbonate (TUMS - DOSED IN MG ELEMENTAL CALCIUM ) 500 MG chewable tablet, Chew 1 tablet by mouth daily., Disp: , Rfl:    ciprofloxacin  (CILOXAN ) 0.3 % ophthalmic solution, Place 1 drop into the left eye every 2 (two) hours. Administer 1 drop, every 2 hours, while awake, for 2 days. Then 1 drop, every 4 hours, while  awake, for the next 5 days., Disp: 5 mL, Rfl: 0   methocarbamol  (ROBAXIN ) 500 MG tablet, Take 1 tablet (500 mg total) by mouth 2 (two) times daily., Disp: 20 tablet, Rfl: 0   naproxen  (NAPROSYN ) 500 MG tablet, Take 1 tablet (500 mg total) by mouth 2 (two) times daily with a meal., Disp: 30 tablet, Rfl: 0   ondansetron  (ZOFRAN  ODT) 4 MG disintegrating tablet, Take 1 tablet (4 mg total) by mouth every 6 (six) hours as needed for nausea., Disp: 20 tablet, Rfl: 2   pantoprazole  (PROTONIX ) 40 MG tablet, Take 1 tablet (40 mg total) by mouth daily., Disp: 30 tablet, Rfl: 3   sodium chloride  0.9 % infusion, Inject 10 mLs into the vein continuous., Disp: , Rfl:   "
